# Patient Record
Sex: Female | Born: 1995 | Race: White | Hispanic: No | Marital: Single | Smoking: Never smoker
Health system: Southern US, Community
[De-identification: ages and names within clinical notes are randomized; demographics above are authoritative.]

## PROBLEM LIST (undated history)

## (undated) DIAGNOSIS — F1910 Other psychoactive substance abuse, uncomplicated: Secondary | ICD-10-CM

## (undated) DIAGNOSIS — D649 Anemia, unspecified: Secondary | ICD-10-CM

## (undated) HISTORY — PX: TONSILLECTOMY: SUR1361

---
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
## 2013-09-17 ENCOUNTER — Encounter (HOSPITAL_COMMUNITY): Payer: Self-pay | Admitting: Emergency Medicine

## 2013-09-17 ENCOUNTER — Emergency Department (HOSPITAL_COMMUNITY)
Admission: EM | Admit: 2013-09-17 | Discharge: 2013-09-17 | Disposition: A | Discharge status: Home / Self Care | Payer: 59 | Attending: Emergency Medicine | Admitting: Emergency Medicine

## 2013-09-17 DIAGNOSIS — R5381 Other malaise: Secondary | ICD-10-CM | POA: Insufficient documentation

## 2013-09-17 DIAGNOSIS — R05 Cough: Secondary | ICD-10-CM | POA: Insufficient documentation

## 2013-09-17 DIAGNOSIS — IMO0001 Reserved for inherently not codable concepts without codable children: Secondary | ICD-10-CM | POA: Insufficient documentation

## 2013-09-17 DIAGNOSIS — R059 Cough, unspecified: Secondary | ICD-10-CM | POA: Insufficient documentation

## 2013-09-17 DIAGNOSIS — J351 Hypertrophy of tonsils: Secondary | ICD-10-CM

## 2013-09-17 DIAGNOSIS — J029 Acute pharyngitis, unspecified: Secondary | ICD-10-CM | POA: Insufficient documentation

## 2013-09-17 DIAGNOSIS — J039 Acute tonsillitis, unspecified: Secondary | ICD-10-CM | POA: Insufficient documentation

## 2013-09-17 LAB — MONONUCLEOSIS SCREEN: Mono Screen: NEGATIVE

## 2013-09-17 MED ORDER — DEXAMETHASONE SODIUM PHOSPHATE 10 MG/ML IJ SOLN
10.0000 mg | Freq: Once | INTRAMUSCULAR | Status: AC
  Administered 2013-09-17: 10 mg via INTRAMUSCULAR
  Filled 2013-09-17: qty 1

## 2013-09-17 MED ORDER — OXYCODONE HCL 5 MG/5ML PO SOLN: 5.0000 mg | ORAL | Status: DC | PRN

## 2013-09-17 NOTE — ED Provider Notes (Signed)
CSN: [id number]     Arrival date & time 09/17/13  1934 History  This chart was scribed for non-physician practitioner Dierdre Forth, PA-C working with Juliet Rude. Rubin Payor, MD by Danella Maiers, ED Scribe. This patient was seen in room TR05C/TR05C and the patient's care was started at 8:02 PM.   Chief Complaint  Patient presents with  . Sore Throat   The history is provided by the patient. No language interpreter was used.   HPI Comments: Deborah Houston is a 17 y.o. female who presents to the Emergency Department complaining of unchanged sore throat for the past 3 weeks. She also reports body aches and only mild occasional cough. Pt completed oral Augmentin two days ago given by [facility] medical clinic with no improvement. She denies fevers, chills, ear pain. She denies sick contacts. She is otherwise healthy. She is on no medications currently. She has not been tested for mono or strep.  She endorses associated fatigue and throat swelling.  Nothing makes her symptoms better or worse.     History reviewed. No pertinent past medical history. History reviewed. No pertinent past surgical history. Family History  Problem Relation Age of Onset  . Diabetes Mother   . Alcohol abuse Father    History  Substance Use Topics  . Smoking status: Never Smoker   . Smokeless tobacco: Not on file  . Alcohol Use: No   OB History   Grav Para Term Preterm Abortions TAB SAB Ect Mult Living                 Review of Systems  Constitutional: Positive for fatigue. Negative for fever, chills, diaphoresis, appetite change and unexpected weight change.  HENT: Positive for sore throat. Negative for mouth sores.   Eyes: Negative for visual disturbance.  Respiratory: Negative for cough, chest tightness, shortness of breath and wheezing.   Cardiovascular: Negative for chest pain.  Gastrointestinal: Negative for nausea, vomiting, abdominal pain, diarrhea and constipation.  Endocrine: Negative for  polydipsia, polyphagia and polyuria.  Genitourinary: Negative for dysuria, urgency, frequency and hematuria.  Musculoskeletal: Positive for myalgias. Negative for back pain and neck stiffness.  Skin: Negative for rash.  Allergic/Immunologic: Negative for immunocompromised state.  Neurological: Negative for syncope, light-headedness and headaches.  Hematological: Does not bruise/bleed easily.  Psychiatric/Behavioral: Negative for sleep disturbance. The patient is not nervous/anxious.     Allergies  Review of patient's allergies indicates no known allergies.  Home Medications   Current Outpatient Rx  Name  Route  Sig  Dispense  Refill  . Pseudoephedrine-APAP-DM (DAYQUIL PO)   Oral   Take 10 mLs by mouth daily as needed (for cough).         Marland Kitchen oxyCODONE (ROXICODONE) 5 MG/5ML solution   Oral   Take 5 mLs (5 mg total) by mouth every 4 (four) hours as needed for severe pain.   30 mL   0    BP 122/60  Pulse 73  Temp(Src) 98.3 F (36.8 C) (Oral)  Resp 14  Ht 5\' 8"  (1.727 m)  Wt 140 lb (63.504 kg)  BMI 21.29 kg/m2  SpO2 98%  LMP 08/23/2013 Physical Exam  Nursing note and vitals reviewed. Constitutional: She is oriented to person, place, and time. She appears well-developed and well-nourished. No distress.  Awake, alert, nontoxic appearance  HENT:  Head: Normocephalic and atraumatic.  Right Ear: Tympanic membrane, external ear and ear canal normal.  Left Ear: Tympanic membrane, external ear and ear canal normal.  Nose: No mucosal edema  or rhinorrhea. No epistaxis. Right sinus exhibits no maxillary sinus tenderness and no frontal sinus tenderness. Left sinus exhibits no maxillary sinus tenderness and no frontal sinus tenderness.  Mouth/Throat: Uvula is midline and mucous membranes are normal. Mucous membranes are not pale and not cyanotic. No uvula swelling. Posterior oropharyngeal edema and posterior oropharyngeal erythema present. No oropharyngeal exudate or tonsillar  abscesses.  Erythema with large tonsils, no exudate Uvula nonswollen and midline  Eyes: Conjunctivae are normal. Pupils are equal, round, and reactive to light. No scleral icterus.  Neck: Normal range of motion and full passive range of motion without pain. Neck supple.  Cardiovascular: Normal rate, regular rhythm, normal heart sounds and intact distal pulses.   No murmur heard. Pulmonary/Chest: Effort normal and breath sounds normal. No stridor. No respiratory distress. She has no wheezes.  Abdominal: Soft. Bowel sounds are normal. She exhibits no mass. There is no tenderness. There is no rebound and no guarding.  Musculoskeletal: Normal range of motion. She exhibits no edema.  Lymphadenopathy:       Head (right side): Submandibular and tonsillar adenopathy present. No submental, no preauricular, no posterior auricular and no occipital adenopathy present.       Head (left side): Submandibular and tonsillar adenopathy present. No submental, no preauricular, no posterior auricular and no occipital adenopathy present.    She has cervical adenopathy.       Right cervical: Superficial cervical adenopathy present. No deep cervical and no posterior cervical adenopathy present.      Left cervical: Superficial cervical adenopathy present. No deep cervical and no posterior cervical adenopathy present.  Mild, discrete, tender mobile superficial cervical lymphadenopathy  Neurological: She is alert and oriented to person, place, and time.  Speech is clear and goal oriented Moves extremities without ataxia  Skin: Skin is warm and dry. No purpura and no rash noted. She is not diaphoretic.  No rash  Psychiatric: She has a normal mood and affect.    ED Course  Procedures (including critical care time) Medications  dexamethasone (DECADRON) injection 10 mg (10 mg Intramuscular Given 09/17/13 2041)    DIAGNOSTIC STUDIES: Oxygen Saturation is 98% on RA, normal by my interpretation.    COORDINATION OF  CARE: 8:25 PM- Discussed treatment plan with pt which includes strep and mono test. Pt agrees to plan.    Labs Review Labs Reviewed  RAPID STREP SCREEN  CULTURE, GROUP A STREP  GONOCOCCUS CULTURE  MONONUCLEOSIS SCREEN   Imaging Review No results found.  EKG Interpretation   None       MDM   1. Sore throat   2. Tonsillar enlargement      Berdine Dance presents with persistent sore throat for > 3 weeks.  Strep test negative (which was to be expected as pt has had a full course of abx).  Concern for possible mono.  Will give decadron and test.    9:47 PM Mono negative.  Pt afebrile without tonsillar exudate, negative strep. Presents with mild cervical lymphadenopathy, & dysphagia.  Concern for possible gonorrhea as ssx have not cleared with other treatments and pt is sexually active.  No further abx indicated until cultures return.  Pt does not appear dehydrated, but did discuss importance of water rehydration. Presentation non concerning for PTA or infxn spread to soft tissue. No trismus or uvula deviation. Specific return precautions discussed. Pt able to drink water in ED without difficulty with intact air way. Recommended PCP follow up.  It has been determined that no  acute conditions requiring further emergency intervention are present at this time. The patient/guardian have been advised of the diagnosis and plan. We have discussed signs and symptoms that warrant return to the ED, such as changes or worsening in symptoms.   Vital signs are stable at discharge.   BP 122/60  Pulse 73  Temp(Src) 98.3 F (36.8 C) (Oral)  Resp 14  Ht 5\' 8"  (1.727 m)  Wt 140 lb (63.504 kg)  BMI 21.29 kg/m2  SpO2 98%  LMP 08/23/2013  Patient/guardian has voiced understanding and agreed to follow-up with the PCP or specialist.       Dierdre Forth, PA-C 09/17/13 2152

## 2013-09-17 NOTE — ED Notes (Signed)
Pt. reports sore throat with swelling , " hard to swallow " for 3 weeks , pt. had completed oral Augmentin antibiotic regimen with no improvement . Denies fever or chills. Airway intact / respirations unlabored .

## 2013-09-18 NOTE — ED Provider Notes (Signed)
Medical screening examination/treatment/procedure(s) were performed by non-physician practitioner and as supervising physician I was immediately available for consultation/collaboration.  EKG Interpretation   None        Brennyn Haisley R. Frederik Standley, MD 09/18/13 0004 

## 2013-09-20 LAB — CULTURE, GROUP A STREP

## 2013-09-20 LAB — GONOCOCCUS CULTURE: Special Requests: NORMAL

## 2013-11-29 ENCOUNTER — Emergency Department (HOSPITAL_COMMUNITY)
Admission: EM | Admit: 2013-11-29 | Discharge: 2013-11-29 | Disposition: A | Discharge status: Home / Self Care | Payer: 59 | Source: Home / Self Care | Attending: Family Medicine | Admitting: Family Medicine

## 2013-11-29 ENCOUNTER — Encounter (HOSPITAL_COMMUNITY): Payer: Self-pay | Admitting: Emergency Medicine

## 2013-11-29 DIAGNOSIS — A088 Other specified intestinal infections: Secondary | ICD-10-CM

## 2013-11-29 DIAGNOSIS — A084 Viral intestinal infection, unspecified: Secondary | ICD-10-CM

## 2013-11-29 LAB — COMPREHENSIVE METABOLIC PANEL
ALBUMIN: 3.7 g/dL (ref 3.5–5.2)
ALT: 12 U/L (ref 0–35)
AST: 19 U/L (ref 0–37)
Alkaline Phosphatase: 72 U/L (ref 39–117)
BUN: 12 mg/dL (ref 6–23)
CALCIUM: 9.2 mg/dL (ref 8.4–10.5)
CO2: 25 mEq/L (ref 19–32)
CREATININE: 0.73 mg/dL (ref 0.50–1.10)
Chloride: 100 mEq/L (ref 96–112)
GFR calc Af Amer: >90 mL/min (ref >90)
Glucose, Bld: 89 mg/dL (ref 70–99)
Potassium: 3.9 mEq/L (ref 3.7–5.3)
Sodium: 138 mEq/L (ref 137–147)
Total Bilirubin: 0.4 mg/dL (ref 0.3–1.2)
Total Protein: 7.5 g/dL (ref 6.0–8.3)

## 2013-11-29 LAB — CBC
HCT: 43 % (ref 36.0–46.0)
Hemoglobin: 14.7 g/dL (ref 12.0–15.0)
MCH: 29.1 pg (ref 26.0–34.0)
MCHC: 34.2 g/dL (ref 30.0–36.0)
MCV: 85.1 fL (ref 78.0–100.0)
PLATELETS: 182 10*3/uL (ref 150–400)
RBC: 5.05 MIL/uL (ref 3.87–5.11)
RDW: 14.1 % (ref 11.5–15.5)
WBC: 5.8 10*3/uL (ref 4.0–10.5)

## 2013-11-29 LAB — LIPASE, BLOOD: Lipase: 66 U/L — ABNORMAL HIGH (ref 11–59)

## 2013-11-29 LAB — POCT URINALYSIS DIP (DEVICE)
Bilirubin Urine: NEGATIVE
Glucose, UA: NEGATIVE mg/dL
Hgb urine dipstick: NEGATIVE
Ketones, ur: NEGATIVE mg/dL
Leukocytes, UA: NEGATIVE
Nitrite: NEGATIVE
PH: 7.5 (ref 5.0–8.0)
Protein, ur: NEGATIVE mg/dL
Specific Gravity, Urine: 1.02 (ref 1.005–1.030)
Urobilinogen, UA: 1 mg/dL (ref 0.0–1.0)

## 2013-11-29 LAB — POCT PREGNANCY, URINE: Preg Test, Ur: NEGATIVE

## 2013-11-29 MED ORDER — ONDANSETRON 4 MG PO TBDP
8.0000 mg | ORAL_TABLET | Freq: Once | ORAL | Status: AC
  Administered 2013-11-29: 8 mg via ORAL

## 2013-11-29 MED ORDER — ONDANSETRON 4 MG PO TBDP
ORAL_TABLET | ORAL | Status: AC
  Filled 2013-11-29: qty 1

## 2013-11-29 MED ORDER — ONDANSETRON HCL 8 MG PO TABS: 8.0000 mg | ORAL_TABLET | Freq: Three times a day (TID) | ORAL | Status: DC | PRN

## 2013-11-29 NOTE — ED Notes (Signed)
Patient had onset of vomiting and abdominal pain, generalized -onset Sunday.  Monday also had diarrhea.  On Tuesday, vomiting stopped.  Sunday and Monday had body aches and head aches.  Denies urinary symptoms, no vaginal discharge

## 2013-11-29 NOTE — Discharge Instructions (Signed)
Thank you for coming in today. Uses Zofran every 8 hours as needed for vomiting starting at 11:00 tonight. Followup with your primary care provider if not getting better.  If your belly pain worsens, or you have high fever, bad vomiting, blood in your stool or black tarry stool go to the Emergency Room.    Viral Gastroenteritis Viral gastroenteritis is also known as stomach flu. This condition affects the stomach and intestinal tract. It can cause sudden diarrhea and vomiting. The illness typically lasts 3 to 8 days. Most people develop an immune response that eventually gets rid of the virus. While this natural response develops, the virus can make you quite ill. CAUSES  Many different viruses can cause gastroenteritis, such as rotavirus or noroviruses. You can catch one of these viruses by consuming contaminated food or water. You may also catch a virus by sharing utensils or other personal items with an infected person or by touching a contaminated surface. SYMPTOMS  The most common symptoms are diarrhea and vomiting. These problems can cause a severe loss of body fluids (dehydration) and a body salt (electrolyte) imbalance. Other symptoms may include:  Fever.  Headache.  Fatigue.  Abdominal pain. DIAGNOSIS  Your caregiver can usually diagnose viral gastroenteritis based on your symptoms and a physical exam. A stool sample may also be taken to test for the presence of viruses or other infections. TREATMENT  This illness typically goes away on its own. Treatments are aimed at rehydration. The most serious cases of viral gastroenteritis involve vomiting so severely that you are not able to keep fluids down. In these cases, fluids must be given through an intravenous line (IV). HOME CARE INSTRUCTIONS   Drink enough fluids to keep your urine clear or pale yellow. Drink small amounts of fluids frequently and increase the amounts as tolerated.  Ask your caregiver for specific rehydration  instructions.  Avoid:  Foods high in sugar.  Alcohol.  Carbonated drinks.  Tobacco.  Juice.  Caffeine drinks.  Extremely hot or cold fluids.  Fatty, greasy foods.  Too much intake of anything at one time.  Dairy products until 24 to 48 hours after diarrhea stops.  You may consume probiotics. Probiotics are active cultures of beneficial bacteria. They may lessen the amount and number of diarrheal stools in adults. Probiotics can be found in yogurt with active cultures and in supplements.  Wash your hands well to avoid spreading the virus.  Only take over-the-counter or prescription medicines for pain, discomfort, or fever as directed by your caregiver. Do not give aspirin to children. Antidiarrheal medicines are not recommended.  Ask your caregiver if you should continue to take your regular prescribed and over-the-counter medicines.  Keep all follow-up appointments as directed by your caregiver. SEEK IMMEDIATE MEDICAL CARE IF:   You are unable to keep fluids down.  You do not urinate at least once every 6 to 8 hours.  You develop shortness of breath.  You notice blood in your stool or vomit. This may look like coffee grounds.  You have abdominal pain that increases or is concentrated in one small area (localized).  You have persistent vomiting or diarrhea.  You have a fever.  The patient is a child younger than 3 months, and he or she has a fever.  The patient is a child older than 3 months, and he or she has a fever and persistent symptoms.  The patient is a child older than 3 months, and he or she has a  fever and symptoms suddenly get worse.  The patient is a baby, and he or she has no tears when crying. MAKE SURE YOU:   Understand these instructions.  Will watch your condition.  Will get help right away if you are not doing well or get worse. Document Released: 10/05/2005 Document Revised: 12/28/2011 Document Reviewed: 07/22/2011 [facility] Patient  Information 2014 [place], [place].

## 2013-11-29 NOTE — ED Provider Notes (Signed)
Berdine DanceKayla Houston is a 18 y.o. female who presents to Urgent Care today for vomiting fever and body aches. This is associated with abdominal pain and and has been present for the last several days. She denies any dysuria or urgency urinary frequency or vaginal discharge. The pain is located diffusely across the abdomen. The pain is worsened over the past day or so. She's tried Pepto-Bismol which helps. She denies any blood in her vomit or diarrhea.   History reviewed. No pertinent past medical history. History  Substance Use Topics  . Smoking status: Never Smoker   . Smokeless tobacco: Not on file  . Alcohol Use: No   ROS as above Medications: No current facility-administered medications for this encounter.   Current Outpatient Prescriptions  Medication Sig Dispense Refill  . bismuth subsalicylate (PEPTO BISMOL) 262 MG/15ML suspension Take 30 mLs by mouth every 6 (six) hours as needed.      . calcium carbonate (TUMS - DOSED IN MG ELEMENTAL CALCIUM) 500 MG chewable tablet Chew 1 tablet by mouth daily.      . ondansetron (ZOFRAN) 8 MG tablet Take 1 tablet (8 mg total) by mouth every 8 (eight) hours as needed for nausea or vomiting.  20 tablet  0  . oxyCODONE (ROXICODONE) 5 MG/5ML solution Take 5 mLs (5 mg total) by mouth every 4 (four) hours as needed for severe pain.  30 mL  0  . Pseudoephedrine-APAP-DM (DAYQUIL PO) Take 10 mLs by mouth daily as needed (for cough).        Exam:  BP 116/74  Pulse 88  Temp(Src) 98.2 F (36.8 C) (Oral)  Resp 19  SpO2 99%  LMP 11/07/2013 Gen: Well NAD HEENT: EOMI,  MMM Lungs: Normal work of breathing. CTABL Heart: RRR no MRG Abd: NABS, Soft. Nondistended. Diffusely tender. Mild guarding present Exts: Brisk capillary refill, warm and well perfused.   Results for orders placed during the hospital encounter of 11/29/13 (from the past 24 hour(s))  POCT URINALYSIS DIP (DEVICE)     Status: None   Collection Time    11/29/13  1:51 PM      Result Value Ref  Range   Glucose, UA NEGATIVE  NEGATIVE mg/dL   Bilirubin Urine NEGATIVE  NEGATIVE   Ketones, ur NEGATIVE  NEGATIVE mg/dL   Specific Gravity, Urine 1.020  1.005 - 1.030   Hgb urine dipstick NEGATIVE  NEGATIVE   pH 7.5  5.0 - 8.0   Protein, ur NEGATIVE  NEGATIVE mg/dL   Urobilinogen, UA 1.0  0.0 - 1.0 mg/dL   Nitrite NEGATIVE  NEGATIVE   Leukocytes, UA NEGATIVE  NEGATIVE  POCT PREGNANCY, URINE     Status: None   Collection Time    11/29/13  1:55 PM      Result Value Ref Range   Preg Test, Ur NEGATIVE  NEGATIVE  CBC     Status: None   Collection Time    11/29/13  2:07 PM      Result Value Ref Range   WBC 5.8  4.0 - 10.5 K/uL   RBC 5.05  3.87 - 5.11 MIL/uL   Hemoglobin 14.7  12.0 - 15.0 g/dL   HCT 16.143.0  09.636.0 - 04.546.0 %   MCV 85.1  78.0 - 100.0 fL   MCH 29.1  26.0 - 34.0 pg   MCHC 34.2  30.0 - 36.0 g/dL   RDW 40.914.1  81.111.5 - 91.415.5 %   Platelets 182  150 - 400 K/uL   No  results found.  Assessment and Plan: 18 y.o. female with viral gastroenteritis. Patient has normal laboratory findings and mild tenderness her abdominal exam. Plan to use Zofran as needed. Continue oral fluids and followup as needed. CMP and lipase are pending. Will call patient if results are significantly abnormal.  Discussed warning signs or symptoms. Please see discharge instructions. Patient expresses understanding.    Rodolph Bong, MD 11/29/13 1450

## 2013-12-26 ENCOUNTER — Ambulatory Visit: Payer: Self-pay | Admitting: Otolaryngology

## 2014-01-03 ENCOUNTER — Encounter (HOSPITAL_COMMUNITY)
Admission: EM | Disposition: A | Discharge status: Home / Self Care | Payer: Self-pay | Source: Home / Self Care | Attending: Emergency Medicine

## 2014-01-03 ENCOUNTER — Encounter (HOSPITAL_COMMUNITY): Payer: 59 | Admitting: Anesthesiology

## 2014-01-03 ENCOUNTER — Encounter (HOSPITAL_COMMUNITY): Payer: Self-pay | Admitting: Emergency Medicine

## 2014-01-03 ENCOUNTER — Emergency Department (HOSPITAL_COMMUNITY): Payer: 59 | Admitting: Anesthesiology

## 2014-01-03 ENCOUNTER — Ambulatory Visit (HOSPITAL_COMMUNITY)
Admission: EM | Admit: 2014-01-03 | Discharge: 2014-01-03 | Disposition: A | Discharge status: Home / Self Care | Payer: 59 | Attending: Emergency Medicine | Admitting: Emergency Medicine

## 2014-01-03 DIAGNOSIS — IMO0002 Reserved for concepts with insufficient information to code with codable children: Secondary | ICD-10-CM | POA: Insufficient documentation

## 2014-01-03 DIAGNOSIS — J95830 Postprocedural hemorrhage and hematoma of a respiratory system organ or structure following a respiratory system procedure: Secondary | ICD-10-CM

## 2014-01-03 DIAGNOSIS — Y836 Removal of other organ (partial) (total) as the cause of abnormal reaction of the patient, or of later complication, without mention of misadventure at the time of the procedure: Secondary | ICD-10-CM | POA: Insufficient documentation

## 2014-01-03 DIAGNOSIS — D649 Anemia, unspecified: Secondary | ICD-10-CM | POA: Insufficient documentation

## 2014-01-03 HISTORY — PX: TONSILLECTOMY: SHX5217

## 2014-01-03 HISTORY — DX: Anemia, unspecified: D64.9

## 2014-01-03 LAB — CBC
HCT: 39.9 % (ref 36.0–46.0)
HEMOGLOBIN: 13.2 g/dL (ref 12.0–15.0)
MCH: 28.5 pg (ref 26.0–34.0)
MCHC: 33.1 g/dL (ref 30.0–36.0)
MCV: 86.2 fL (ref 78.0–100.0)
Platelets: 331 10*3/uL (ref 150–400)
RBC: 4.63 MIL/uL (ref 3.87–5.11)
RDW: 14.1 % (ref 11.5–15.5)
WBC: 12 10*3/uL — ABNORMAL HIGH (ref 4.0–10.5)

## 2014-01-03 LAB — PROTIME-INR
INR: 1.09 (ref 0.00–1.49)
PROTHROMBIN TIME: 13.9 s (ref 11.6–15.2)

## 2014-01-03 SURGERY — TONSILLECTOMY
Anesthesia: General | Site: Throat

## 2014-01-03 MED ORDER — ALBUMIN HUMAN 5 % IV SOLN
INTRAVENOUS | Status: AC
  Administered 2014-01-03: 12.5 g via INTRAVENOUS
  Filled 2014-01-03: qty 250

## 2014-01-03 MED ORDER — LIDOCAINE HCL (CARDIAC) 20 MG/ML IV SOLN
INTRAVENOUS | Status: DC | PRN
  Administered 2014-01-03: 1 mg via INTRAVENOUS
  Administered 2014-01-03: 100 mg via INTRAVENOUS

## 2014-01-03 MED ORDER — OXYCODONE HCL 5 MG/5ML PO SOLN: 5.0000 mg | Freq: Once | ORAL | Status: DC | PRN

## 2014-01-03 MED ORDER — MIDAZOLAM HCL 2 MG/2ML IJ SOLN
INTRAMUSCULAR | Status: DC | PRN
  Administered 2014-01-03 (×2): 1 mg via INTRAVENOUS

## 2014-01-03 MED ORDER — FENTANYL CITRATE 0.05 MG/ML IJ SOLN
INTRAMUSCULAR | Status: AC
  Filled 2014-01-03: qty 5

## 2014-01-03 MED ORDER — DEXAMETHASONE SODIUM PHOSPHATE 10 MG/ML IJ SOLN
INTRAMUSCULAR | Status: DC | PRN
  Administered 2014-01-03: 10 mg via INTRAVENOUS

## 2014-01-03 MED ORDER — ESMOLOL HCL 10 MG/ML IV SOLN
10.0000 mg | Freq: Once | INTRAVENOUS | Status: AC
  Administered 2014-01-03: 10 mg via INTRAVENOUS
  Filled 2014-01-03: qty 1

## 2014-01-03 MED ORDER — LORAZEPAM 2 MG/ML IJ SOLN
INTRAMUSCULAR | Status: AC
  Filled 2014-01-03: qty 1

## 2014-01-03 MED ORDER — LIDOCAINE HCL (CARDIAC) 20 MG/ML IV SOLN
INTRAVENOUS | Status: AC
  Filled 2014-01-03: qty 10

## 2014-01-03 MED ORDER — MIDAZOLAM HCL 2 MG/2ML IJ SOLN
INTRAMUSCULAR | Status: AC
  Filled 2014-01-03: qty 2

## 2014-01-03 MED ORDER — FENTANYL CITRATE 0.05 MG/ML IJ SOLN
INTRAMUSCULAR | Status: DC | PRN
  Administered 2014-01-03 (×2): 50 ug via INTRAVENOUS

## 2014-01-03 MED ORDER — LIDOCAINE-EPINEPHRINE 1 %-1:100000 IJ SOLN
INTRAMUSCULAR | Status: AC
  Filled 2014-01-03: qty 1

## 2014-01-03 MED ORDER — PROPOFOL 10 MG/ML IV BOLUS
INTRAVENOUS | Status: DC | PRN
  Administered 2014-01-03: 160 mg via INTRAVENOUS

## 2014-01-03 MED ORDER — SODIUM CHLORIDE 0.9 % IV SOLN
INTRAVENOUS | Status: DC | PRN
Start: 2014-01-03 — End: 2014-01-03
  Administered 2014-01-03 (×2): via INTRAVENOUS

## 2014-01-03 MED ORDER — PROMETHAZINE HCL 25 MG/ML IJ SOLN: 6.2500 mg | INTRAMUSCULAR | Status: DC | PRN

## 2014-01-03 MED ORDER — HYDROMORPHONE HCL PF 1 MG/ML IJ SOLN: 0.2500 mg | INTRAMUSCULAR | Status: DC | PRN

## 2014-01-03 MED ORDER — 0.9 % SODIUM CHLORIDE (POUR BTL) OPTIME
TOPICAL | Status: DC | PRN
  Administered 2014-01-03: 1000 mL

## 2014-01-03 MED ORDER — ONDANSETRON HCL 4 MG/2ML IJ SOLN
INTRAMUSCULAR | Status: DC | PRN
  Administered 2014-01-03: 4 mg via INTRAVENOUS

## 2014-01-03 MED ORDER — SUCCINYLCHOLINE CHLORIDE 20 MG/ML IJ SOLN
INTRAMUSCULAR | Status: DC | PRN
  Administered 2014-01-03: 100 mg via INTRAVENOUS

## 2014-01-03 MED ORDER — SODIUM CHLORIDE 0.9 % IV BOLUS (SEPSIS)
1000.0000 mL | Freq: Once | INTRAVENOUS | Status: AC
  Administered 2014-01-03: 1000 mL via INTRAVENOUS

## 2014-01-03 MED ORDER — PROPOFOL 10 MG/ML IV BOLUS
INTRAVENOUS | Status: AC
  Filled 2014-01-03: qty 20

## 2014-01-03 MED ORDER — ALBUMIN HUMAN 5 % IV SOLN
12.5000 g | INTRAVENOUS | Status: AC | PRN
  Administered 2014-01-03 (×2): 12.5 g via INTRAVENOUS

## 2014-01-03 MED ORDER — ESMOLOL HCL 10 MG/ML IV SOLN
INTRAVENOUS | Status: DC | PRN
  Administered 2014-01-03: 10 mg via INTRAVENOUS

## 2014-01-03 MED ORDER — ESMOLOL BOLUS VIA INFUSION: 500.0000 ug/kg | Freq: Once | INTRAVENOUS | Status: DC

## 2014-01-03 MED ORDER — LORAZEPAM 2 MG/ML IJ SOLN
1.0000 mg | Freq: Once | INTRAMUSCULAR | Status: AC
  Administered 2014-01-03: 1 mg via INTRAVENOUS
  Filled 2014-01-03: qty 1

## 2014-01-03 MED ORDER — OXYCODONE HCL 5 MG PO TABS: 5.0000 mg | ORAL_TABLET | Freq: Once | ORAL | Status: DC | PRN

## 2014-01-03 SURGICAL SUPPLY — 39 items
CANISTER SUCTION 2500CC (MISCELLANEOUS) ×3 IMPLANT
CATH ROBINSON RED A/P 10FR (CATHETERS) IMPLANT
CLEANER TIP ELECTROSURG 2X2 (MISCELLANEOUS) ×3 IMPLANT
COAGULATOR SUCT 6 FR SWTCH (ELECTROSURGICAL) ×1
COAGULATOR SUCT SWTCH 10FR 6 (ELECTROSURGICAL) ×2 IMPLANT
CRADLE DONUT ADULT HEAD (MISCELLANEOUS) IMPLANT
ELECT COATED BLADE 2.86 ST (ELECTRODE) ×3 IMPLANT
ELECT REM PT RETURN 9FT ADLT (ELECTROSURGICAL)
ELECT REM PT RETURN 9FT PED (ELECTROSURGICAL)
ELECTRODE REM PT RETRN 9FT PED (ELECTROSURGICAL) IMPLANT
ELECTRODE REM PT RTRN 9FT ADLT (ELECTROSURGICAL) IMPLANT
GAUZE SPONGE 4X4 16PLY XRAY LF (GAUZE/BANDAGES/DRESSINGS) ×3 IMPLANT
GLOVE BIO SURGEON STRL SZ 6.5 (GLOVE) ×2 IMPLANT
GLOVE BIO SURGEONS STRL SZ 6.5 (GLOVE) ×1
GLOVE SS BIOGEL STRL SZ 7.5 (GLOVE) ×1 IMPLANT
GLOVE SUPERSENSE BIOGEL SZ 7.5 (GLOVE) ×2
GOWN STRL REUS W/ TWL LRG LVL3 (GOWN DISPOSABLE) ×2 IMPLANT
GOWN STRL REUS W/TWL LRG LVL3 (GOWN DISPOSABLE) ×4
KIT BASIN OR (CUSTOM PROCEDURE TRAY) ×3 IMPLANT
KIT ROOM TURNOVER OR (KITS) ×3 IMPLANT
NS IRRIG 1000ML POUR BTL (IV SOLUTION) ×3 IMPLANT
PACK SURGICAL SETUP 50X90 (CUSTOM PROCEDURE TRAY) ×3 IMPLANT
PAD ARMBOARD 7.5X6 YLW CONV (MISCELLANEOUS) ×6 IMPLANT
PENCIL BUTTON HOLSTER BLD 10FT (ELECTRODE) ×3 IMPLANT
PENCIL FOOT CONTROL (ELECTRODE) ×3 IMPLANT
SPECIMEN JAR SMALL (MISCELLANEOUS) ×6 IMPLANT
SPONGE TONSIL 1 RF SGL (DISPOSABLE) ×3 IMPLANT
SYR BULB 3OZ (MISCELLANEOUS) ×3 IMPLANT
TOWEL OR 17X24 6PK STRL BLUE (TOWEL DISPOSABLE) ×6 IMPLANT
TUBE CONNECTING 12'X1/4 (SUCTIONS) ×1
TUBE CONNECTING 12X1/4 (SUCTIONS) ×2 IMPLANT
TUBE CONNECTING 20'X1/4 (TUBING) ×1
TUBE CONNECTING 20X1/4 (TUBING) ×2 IMPLANT
TUBE SALEM SUMP 10F W/ARV (TUBING) IMPLANT
TUBE SALEM SUMP 12R W/ARV (TUBING) ×3 IMPLANT
TUBE SALEM SUMP 14F W/ARV (TUBING) IMPLANT
TUBE SALEM SUMP 16 FR W/ARV (TUBING) IMPLANT
WATER STERILE IRR 1000ML POUR (IV SOLUTION) ×3 IMPLANT
YANKAUER SUCT BULB TIP NO VENT (SUCTIONS) ×3 IMPLANT

## 2014-01-03 NOTE — ED Notes (Addendum)
MD at bedside. Upon arrival. EDP [facility]. Pt with active bleeding post tonsillectomy. Pt airway intact. Suctioning without difficulty. LAC IV infiltrated 2nd IV started.  Blood drawn. ENT called by EDP-       [facility]  At 0953 [facility] TRANSFER PT WITH BELONGS TO [facility].

## 2014-01-03 NOTE — ED Notes (Signed)
Bed: [id number] Expected date:  Expected time:  Means of arrival:  Comments: EMS-bleed

## 2014-01-03 NOTE — ED Provider Notes (Signed)
CSN: [id number]     Arrival date & time 01/03/14  0917 History   First MD Initiated Contact with Patient 01/03/14 [phone]     Chief Complaint  Patient presents with  . post op bleeding      (Consider location/radiation/quality/duration/timing/severity/associated sxs/prior Treatment) HPI Comments: Bleeding s/p tonsillectomy 1 week ago - began bleeding yesterday - minimal, now having copious bleeding that began about 30 minutes ago.   Patient is a 18 y.o. female presenting with pharyngitis. The history is provided by the patient.  Sore Throat This is a new problem. The current episode started 1 to 2 hours ago. The problem occurs constantly. The problem has not changed since onset.Pertinent negatives include no chest pain, no abdominal pain and no shortness of breath. Nothing aggravates the symptoms. Nothing relieves the symptoms.    No past medical history on file.  No past surgical history on file. Tonsillectomy 1 week ago Family History  Problem Relation Age of Onset  . Diabetes Mother   . Alcohol abuse Father    History  Substance Use Topics  . Smoking status: Never Smoker   . Smokeless tobacco: Not on file  . Alcohol Use: No   OB History   Grav Para Term Preterm Abortions TAB SAB Ect Mult Living                 Review of Systems  Constitutional: Negative for fever.  Respiratory: Negative for cough and shortness of breath.   Cardiovascular: Negative for chest pain.  Gastrointestinal: Negative for abdominal pain.  All other systems reviewed and are negative.      Allergies  Review of patient's allergies indicates no known allergies.  Home Medications   Current Outpatient Rx  Name  Route  Sig  Dispense  Refill  . bismuth subsalicylate (PEPTO BISMOL) 262 MG/15ML suspension   Oral   Take 30 mLs by mouth every 6 (six) hours as needed.         . calcium carbonate (TUMS - DOSED IN MG ELEMENTAL CALCIUM) 500 MG chewable tablet   Oral   Chew 1 tablet by mouth daily.         . ondansetron (ZOFRAN) 8 MG tablet   Oral   Take 1 tablet (8 mg total) by mouth every 8 (eight) hours as needed for nausea or vomiting.   20 tablet   0   . oxyCODONE (ROXICODONE) 5 MG/5ML solution   Oral   Take 5 mLs (5 mg total) by mouth every 4 (four) hours as needed for severe pain.   30 mL   0   . Pseudoephedrine-APAP-DM (DAYQUIL PO)   Oral   Take 10 mLs by mouth daily as needed (for cough).          There were no vitals taken for this visit. Physical Exam  Nursing note and vitals reviewed. Constitutional: She is oriented to person, place, and time. She appears well-developed and well-nourished. No distress.  HENT:  Head: Normocephalic and atraumatic.  Mouth/Throat:    Eyes: EOM are normal. Pupils are equal, round, and reactive to light.  Neck: Normal range of motion. Neck supple.  Cardiovascular: Normal rate and regular rhythm.  Exam reveals no friction rub.   No murmur heard. Pulmonary/Chest: Effort normal and breath sounds normal. No respiratory distress. She has no wheezes. She has no rales.  Abdominal: Soft. She exhibits no distension. There is no tenderness. There is no rebound.  Musculoskeletal: Normal range of motion. She exhibits no  edema.  Neurological: She is alert and oriented to person, place, and time.  Skin: She is not diaphoretic.    ED Course  Procedures (including critical care time) Labs Review Labs Reviewed  CBC  PROTIME-INR   Imaging Review No results found.   EKG Interpretation None      MDM   Final diagnoses:  Post-tonsillectomy hemorrhage    22F 1 week s/p tonsillectomy by Dr. [place]ennett [place] presents with bleeding. Tonsils began bleeding yesterday, but greatly increased this morning. Have been bleeding for roughly 45 minutes. Not on any pain meds or anticoagulants. States she didn't eat or do anything else to cause the bleeding.  On exam, copious bleeding from posterior pharynx. Constantly spitting out blood, clots.  No respiratory distress, lungs clear, patient feels like if she lies down she might choke on her own blood.  ENT consulted. [facility] Jear[facility]Byers [facility] Dr. Blinda LeatherwoodPollina aware [place] coming [place] [facility]. Ativan given for patient to help with anxiety.  Dagmar HaitWilliam Dezmen Alcock, MD 01/03/14 [phone]

## 2014-01-03 NOTE — Op Note (Signed)
Preop/postop diagnoses: Tonsil hemorrhage Procedure: Cauterization of bilateral tonsil hemorrhage Anesthesia: Gen. Estimated blood loss: Approximately 5 cc Indications: 18 year old who is 8 days post tonsillectomy and had bleeding yesterday. Seen by her ear nose and throat doctor and treated conservatively. This morning she started bleeding profusely and has been bleeding for approximately an hour and half. She does have a history of a anemia which is treated with iron. Her hemoglobin was 14 yesterday and 13 at presentation today. She has been informed of the procedure including risks, benefits, and options. All her questions are answered and consent was obtained. Operation: Patient was taken to the operating room and after general endotracheal tube anesthesia was placed in the rose position and the Crowe-Davis mouth gag was inserted retracted and suspended from the Mayo stand. The right tonsil had some clot on it and this was removed. There was a small area of oozing that was cauterized with the suction cautery. There was the expected amount eschar. The left tonsil a clot was removed and immediately a arterial bleed was present. It was easily cauterized with the suction cautery and immediately stopped. There was also an  area in  the superior pole that was oozing but no arterial bleeding. That was cauterized as well. The area was irrigated with saline. There was good hemostasis. The Crowe-Davis was released and resuspended and there was good hemostasis in all locations. The esophagus and stomach were suctioned out with an NG tube and a lot of clot was suctioned. She was then awakened brought to recovery in stable condition counts correct

## 2014-01-03 NOTE — ED Notes (Signed)
Per EMS- Patient had a tonsillectomy a week ago and began having bleeding this AM with clots. EMS reported approx 250 ml blood loss with clots. Patient severely anxious. Patient was given 200 ml NS prior to arrival to the ED.

## 2014-01-03 NOTE — ED Notes (Signed)
Pt transported to OR with RN and ENT physician at bedside.

## 2014-01-03 NOTE — Discharge Instructions (Signed)
Deborah DanceKayla Houston may need 2-3 days of time off from work due to her current medical condition. 01/03/14

## 2014-01-03 NOTE — Preoperative (Signed)
Beta Blockers   Reason not to administer Beta Blockers:Not Applicable 

## 2014-01-03 NOTE — Anesthesia Procedure Notes (Signed)
Procedure Name: Intubation Date/Time: 01/03/2014 10:43 AM Performed by: Marena ChancyBECKNER, Brunette Lavalle S Pre-anesthesia Checklist: Patient identified, Emergency Drugs available, Suction available, Patient being monitored and Timeout performed Patient Re-evaluated:Patient Re-evaluated prior to inductionOxygen Delivery Method: Circle system utilized Preoxygenation: Pre-oxygenation with 100% oxygen Intubation Type: IV induction, Cricoid Pressure applied and Rapid sequence Laryngoscope Size: Mac and 3 Grade View: Grade I Tube type: Oral Tube size: 7.0 mm Number of attempts: 1 Airway Equipment and Method: Stylet Placement Confirmation: ETT inserted through vocal cords under direct vision,  positive ETCO2 and CO2 detector Secured at: 21 cm Dental Injury: Teeth and Oropharynx as per pre-operative assessment and Bloody posterior oropharynx  Comments: Oropharynx suctioned for copious amounts of blood d/t bleeding tonsil. Intubation performed by Delia ChimesAshley Vosh, CRNA

## 2014-01-03 NOTE — Progress Notes (Signed)
Pt is stable to go home, HR came down to 130's, no pain or nausea. Per anesthesia pt can go home when ever she feels comfortable. Pt mentioned going to her grandparents house with friend Alecia LemmingVictor. Rolled her out in a wheel chair. No complaints.  Danne HarborJohny,RN

## 2014-01-03 NOTE — OR Nursing (Signed)
Patient's belongings consists of: a pair of brown Rainbow sandals, Samsung phone with white and pink phone case, gray underwear, black shorts, and gray stretchy yoga pants.  [facility] [employer] class ring and other jewelry removed and placed into specimen container. Pair of contacts removed and placed into sterile container with 0.9% sodium chloride. All belongings placed into patient belonging bag; will transport with patient to PACU.

## 2014-01-03 NOTE — Transfer of Care (Signed)
Immediate Anesthesia Transfer of Care Note  Patient: Deborah Houston  Procedure(s) Performed: Procedure(s): TONSILLAR BLEED (N/A)  Patient Location: PACU  Anesthesia Type:General  Level of Consciousness: lethargic and responds to stimulation  Airway & Oxygen Therapy: Patient Spontanous Breathing and Patient connected to nasal cannula oxygen  Post-op Assessment: Report given to PACU RN and Post -op Vital signs reviewed and stable  Post vital signs: Reviewed and stable  Complications: No apparent anesthesia complications

## 2014-01-03 NOTE — ED Notes (Signed)
Pt arrives [facility] as a transfer [facility] for post op bleeding.

## 2014-01-03 NOTE — ED Notes (Signed)
Blood drawn.

## 2014-01-03 NOTE — Anesthesia Preprocedure Evaluation (Addendum)
Anesthesia Evaluation  Patient identified by MRN, date of birth, ID band Patient awake    Reviewed: Allergy & Precautions, H&P , NPO status , Patient's Chart, lab work & pertinent test results  Airway Mallampati: I      Dental  (+) Teeth Intact   Pulmonary          Cardiovascular     Neuro/Psych    GI/Hepatic   Endo/Other    Renal/GU      Musculoskeletal   Abdominal   Peds  Hematology   Anesthesia Other Findings S/p tonsillectomy-now with significant bleed  Reproductive/Obstetrics                          Anesthesia Physical Anesthesia Plan  ASA: I and emergent  Anesthesia Plan: General   Post-op Pain Management:    Induction: Intravenous, Cricoid pressure planned and Rapid sequence  Airway Management Planned: Oral ETT  Additional Equipment:   Intra-op Plan:   Post-operative Plan: Extubation in OR  Informed Consent: I have reviewed the patients History and Physical, chart, labs and discussed the procedure including the risks, benefits and alternatives for the proposed anesthesia with the patient or authorized representative who has indicated his/her understanding and acceptance.   Dental advisory given  Plan Discussed with: CRNA, Anesthesiologist and Surgeon  Anesthesia Plan Comments:         Anesthesia Quick Evaluation

## 2014-01-03 NOTE — H&P (Signed)
Deborah Houston is an 18 y.o. female.   Chief Complaint: Tonsil hemorrhage HPI: 18 year old with a tonsillectomy about one week ago and had bleeding yesterday. She was seen at her otolaryngologist office and treated conservatively. This morning she started bleeding more profusely and came to the emergency room here and [facility]. She is still currently actively bleeding. She says she has a history of anemia and workup years ago. It is no longer followed but she does take iron. She has no other medical problems.  Past Medical History  Diagnosis Date  . Anemia     Past Surgical History  Procedure Laterality Date  . Tonsillectomy      Family History  Problem Relation Age of Onset  . Diabetes Mother   . Alcohol abuse Father    Social History:  reports that she has never smoked. She does not have any smokeless tobacco history on file. She reports that she does not drink alcohol or use illicit drugs.  Allergies: No Known Allergies  Medications Prior to Admission  Medication Sig Dispense Refill  . bismuth subsalicylate (PEPTO BISMOL) 262 MG/15ML suspension Take 30 mLs by mouth every 6 (six) hours as needed.      . calcium carbonate (TUMS - DOSED IN MG ELEMENTAL CALCIUM) 500 MG chewable tablet Chew 1 tablet by mouth daily.      . ondansetron (ZOFRAN) 8 MG tablet Take 1 tablet (8 mg total) by mouth every 8 (eight) hours as needed for nausea or vomiting.  20 tablet  0  . oxyCODONE (ROXICODONE) 5 MG/5ML solution Take 5 mLs (5 mg total) by mouth every 4 (four) hours as needed for severe pain.  30 mL  0  . Pseudoephedrine-APAP-DM (DAYQUIL PO) Take 10 mLs by mouth daily as needed (for cough).        Results for orders placed during the hospital encounter of 01/03/14 (from the past 48 hour(s))  CBC     Status: Abnormal   Collection Time    01/03/14  9:28 AM      Result Value Ref Range   WBC 12.0 (*) 4.0 - 10.5 K/uL   RBC 4.63  3.87 - 5.11 MIL/uL   Hemoglobin 13.2  12.0 - 15.0 g/dL   HCT 16.1   09.6 - 04.5 %   MCV 86.2  78.0 - 100.0 fL   MCH 28.5  26.0 - 34.0 pg   MCHC 33.1  30.0 - 36.0 g/dL   RDW 40.9  81.1 - 91.4 %   Platelets 331  150 - 400 K/uL  PROTIME-INR     Status: None   Collection Time    01/03/14  9:28 AM      Result Value Ref Range   Prothrombin Time 13.9  11.6 - 15.2 seconds   INR 1.09  0.00 - 1.49   No results found.  Review of Systems  Constitutional: Positive for diaphoresis.  HENT: Positive for sore throat.   Eyes: Negative.   Respiratory: Negative.   Cardiovascular: Negative.   Gastrointestinal: Positive for nausea.  Skin: Negative.     Blood pressure 95/61, pulse 101, temperature 97.7 F (36.5 C), temperature source Axillary, resp. rate 22, SpO2 100.00%. Physical Exam  Constitutional: She appears well-developed.  HENT:  Head: Normocephalic.  She is actively bleeding and has a large clot on the left tonsil. There is no excessive swelling. Neck is without adenopathy or swelling.  Eyes: Pupils are equal, round, and reactive to light.  Neck: Normal range of motion. Neck  supple.  Cardiovascular:  Tachycardia  Respiratory: Effort normal.  GI: Soft.     Assessment/Plan Tonsil hemorrhage-she was informed a risk and benefits of the procedure to immediately go to the operating room for control. Her hemoglobin appears to be stable with 13.2.  Deborah Houston, Deborah Houston 01/03/2014, 11:15 AM

## 2014-01-03 NOTE — ED Notes (Signed)
Pt profusely hemorrhaging from surgical site. Maintaining airway. ENT MD at bedside and ready to transport patient to OR.

## 2014-01-03 NOTE — Anesthesia Postprocedure Evaluation (Signed)
  Anesthesia Post-op Note  Patient: Deborah Houston  Procedure(s) Performed: Procedure(s): TONSILLAR BLEED (N/A)  Patient Location: PACU  Anesthesia Type:General  Level of Consciousness: awake and alert   Airway and Oxygen Therapy: Patient Spontanous Breathing  Post-op Pain: mild  Post-op Assessment: Post-op Vital signs reviewed, Patient's Cardiovascular Status Stable, Respiratory Function Stable, Patent Airway, No signs of Nausea or vomiting and Pain level controlled  Post-op Vital Signs: Reviewed and stable  Complications: No apparent anesthesia complications

## 2014-01-05 ENCOUNTER — Encounter (HOSPITAL_COMMUNITY): Payer: Self-pay | Admitting: Otolaryngology

## 2014-07-22 ENCOUNTER — Encounter (HOSPITAL_COMMUNITY): Payer: Self-pay | Admitting: Emergency Medicine

## 2014-07-22 ENCOUNTER — Emergency Department (HOSPITAL_COMMUNITY)
Admission: EM | Admit: 2014-07-22 | Discharge: 2014-07-22 | Disposition: A | Discharge status: Home / Self Care | Payer: 59 | Attending: Emergency Medicine | Admitting: Emergency Medicine

## 2014-07-22 DIAGNOSIS — Z79899 Other long term (current) drug therapy: Secondary | ICD-10-CM | POA: Diagnosis not present

## 2014-07-22 DIAGNOSIS — R000 Tachycardia, unspecified: Secondary | ICD-10-CM | POA: Diagnosis not present

## 2014-07-22 DIAGNOSIS — Z862 Personal history of diseases of the blood and blood-forming organs and certain disorders involving the immune mechanism: Secondary | ICD-10-CM | POA: Diagnosis not present

## 2014-07-22 DIAGNOSIS — Z9089 Acquired absence of other organs: Secondary | ICD-10-CM | POA: Insufficient documentation

## 2014-07-22 DIAGNOSIS — F419 Anxiety disorder, unspecified: Secondary | ICD-10-CM | POA: Diagnosis not present

## 2014-07-22 DIAGNOSIS — H9201 Otalgia, right ear: Secondary | ICD-10-CM | POA: Diagnosis present

## 2014-07-22 DIAGNOSIS — Z792 Long term (current) use of antibiotics: Secondary | ICD-10-CM | POA: Diagnosis not present

## 2014-07-22 DIAGNOSIS — H6091 Unspecified otitis externa, right ear: Secondary | ICD-10-CM | POA: Insufficient documentation

## 2014-07-22 MED ORDER — CIPROFLOXACIN-DEXAMETHASONE 0.3-0.1 % OT SUSP: 4.0000 [drp] | Freq: Two times a day (BID) | OTIC | Status: DC

## 2014-07-22 NOTE — ED Notes (Signed)
Pt. Stated, i got something in my rt. Ear i don't know if its a bug or what.

## 2014-07-22 NOTE — Discharge Instructions (Signed)
Apply antibiotic ear drops as directed. Do not use q-tips in your ear.  Otitis Externa Otitis externa is a bacterial or fungal infection of the outer ear canal. This is the area from the eardrum to the outside of the ear. Otitis externa is sometimes called "swimmer's ear." CAUSES  Possible causes of infection include:  Swimming in dirty water.  Moisture remaining in the ear after swimming or bathing.  Mild injury (trauma) to the ear.  Objects stuck in the ear (foreign body).  Cuts or scrapes (abrasions) on the outside of the ear. SIGNS AND SYMPTOMS  The first symptom of infection is often itching in the ear canal. Later signs and symptoms may include swelling and redness of the ear canal, ear pain, and yellowish-white fluid (pus) coming from the ear. The ear pain may be worse when pulling on the earlobe. DIAGNOSIS  Your health care provider will perform a physical exam. A sample of fluid may be taken from the ear and examined for bacteria or fungi. TREATMENT  Antibiotic ear drops are often given for 10 to 14 days. Treatment may also include pain medicine or corticosteroids to reduce itching and swelling. HOME CARE INSTRUCTIONS   Apply antibiotic ear drops to the ear canal as prescribed by your health care provider.  Take medicines only as directed by your health care provider.  If you have diabetes, follow any additional treatment instructions from your health care provider.  Keep all follow-up visits as directed by your health care provider. PREVENTION   Keep your ear dry. Use the corner of a towel to absorb water out of the ear canal after swimming or bathing.  Avoid scratching or putting objects inside your ear. This can damage the ear canal or remove the protective wax that lines the canal. This makes it easier for bacteria and fungi to grow.  Avoid swimming in lakes, polluted water, or poorly chlorinated pools.  You may use ear drops made of rubbing alcohol and vinegar  after swimming. Combine equal parts of white vinegar and alcohol in a bottle. Put 3 or 4 drops into each ear after swimming. SEEK MEDICAL CARE IF:   You have a fever.  Your ear is still red, swollen, painful, or draining pus after 3 days.  Your redness, swelling, or pain gets worse.  You have a severe headache.  You have redness, swelling, pain, or tenderness in the area behind your ear. MAKE SURE YOU:   Understand these instructions.  Will watch your condition.  Will get help right away if you are not doing well or get worse. Document Released: 10/05/2005 Document Revised: 02/19/2014 Document Reviewed: 10/22/2011 [facility] Patient Information 2015 [employer] [place]. This information is not intended to replace advice given to you by your health care provider. Make sure you discuss any questions you have with your health care provider.

## 2014-07-22 NOTE — ED Provider Notes (Signed)
Medical screening examination/treatment/procedure(s) were performed by non-physician practitioner and as supervising physician I was immediately available for consultation/collaboration.  Daneil Beem L Azilee Pirro, MD 07/22/14 1519 

## 2014-07-22 NOTE — ED Provider Notes (Signed)
CSN: [id number]     Arrival date & time 07/22/14  4098 History   First MD Initiated Contact with Patient 07/22/14 [phone]     Chief Complaint  Patient presents with  . Otalgia     (Consider location/radiation/quality/duration/timing/severity/associated sxs/prior Treatment) HPI Comments: This is a 18 year old female with a past medical history of anemia and anxiety who presents to the emergency department complaining of right ear pain x2 days. Patient reports over the past 2 days she has noticed a full feeling of her right ear with associated muffled hearing. Today she thought that there may have been something in her ear, she used two ear-wick candles and was able to remove some ear wax. She then thought there may have been a black scab in her ear, developed a sharp, stabbing pain, and got nervous sitting to the emergency department. Denies fever, chills or ear drainage. She uses q-tips.  Patient is a 18 y.o. female presenting with ear pain. The history is provided by the patient.  Otalgia Associated symptoms: no fever     Past Medical History  Diagnosis Date  . Anemia    Past Surgical History  Procedure Laterality Date  . Tonsillectomy    . Tonsillectomy N/A 01/03/2014    Procedure: TONSILLAR BLEED;  Surgeon: Suzanna Obey, MD;  Location: [facility] OR;  Service: ENT;  Laterality: N/A;   Family History  Problem Relation Age of Onset  . Diabetes Mother   . Alcohol abuse Father    History  Substance Use Topics  . Smoking status: Never Smoker   . Smokeless tobacco: Not on file  . Alcohol Use: No   OB History   Grav Para Term Preterm Abortions TAB SAB Ect Mult Living                 Review of Systems  Constitutional: Negative for fever.  HENT: Positive for ear pain.   Respiratory: Negative.   Cardiovascular: Negative.   Skin: Negative.   Neurological: Negative.       Allergies  Review of patient's allergies indicates no known allergies.  Home Medications   Prior to Admission  medications   Medication Sig Start Date End Date Taking? Authorizing [place]  bismuth subsalicylate (PEPTO BISMOL) 262 MG/15ML suspension Take 30 mLs by mouth every 6 (six) hours as needed.    Historical Rufino Staup, MD  calcium carbonate (TUMS - DOSED IN MG ELEMENTAL CALCIUM) 500 MG chewable tablet Chew 1 tablet by mouth daily.    Historical Turner Kunzman, MD  ciprofloxacin-dexamethasone (CIPRODEX) otic suspension Place 4 drops into the right ear 2 (two) times daily. x5 days 07/22/14   Trevor Mace, PA-C  ondansetron (ZOFRAN) 8 MG tablet Take 1 tablet (8 mg total) by mouth every 8 (eight) hours as needed for nausea or vomiting. 11/29/13   Rodolph Bong, MD  oxyCODONE (ROXICODONE) 5 MG/5ML solution Take 5 mLs (5 mg total) by mouth every 4 (four) hours as needed for severe pain. 09/17/13   Hannah Muthersbaugh, PA-C  Pseudoephedrine-APAP-DM (DAYQUIL PO) Take 10 mLs by mouth daily as needed (for cough).    Historical Skylinn Vialpando, MD   BP 133/62  Pulse 117  Temp(Src) 98 F (36.7 C) (Oral)  Resp 20  SpO2 100% Physical Exam  Nursing note and vitals reviewed. Constitutional: She is oriented to person, place, and time. She appears well-developed and well-nourished. No distress.  HENT:  Head: Normocephalic and atraumatic.  Mouth/Throat: Oropharynx is clear and moist.  R ear canal inflamed and erythematous.  No drainage. No FB. Normal TM.  Eyes: Conjunctivae and EOM are normal.  Neck: Normal range of motion. Neck supple.  Cardiovascular: Regular rhythm and normal heart sounds.   Tachycardic.  Pulmonary/Chest: Effort normal and breath sounds normal. No respiratory distress.  Musculoskeletal: Normal range of motion. She exhibits no edema.  Neurological: She is alert and oriented to person, place, and time. No sensory deficit.  Skin: Skin is warm and dry.  Psychiatric: She has a normal mood and affect. Her behavior is normal.    ED Course  Procedures (including critical care time) Labs Review Labs  Reviewed - No data to display  Imaging Review No results found.   EKG Interpretation None      MDM   Final diagnoses:  Otitis externa, right   Pt well appearing and in NAD. Anxious, tachycardic, vitals otherwise stable. Afebrile. Treat with ciprodex. Advised against use of q-tips. Stable for d/c. Return precautions given. Patient states understanding of treatment care plan and is agreeable.  Trevor MaceRobyn M Albert, PA-C 07/22/14 [phone]

## 2014-07-22 NOTE — ED Notes (Signed)
Declined W/C at D/C and was escorted to lobby by RN. 

## 2014-08-09 ENCOUNTER — Emergency Department (INDEPENDENT_AMBULATORY_CARE_PROVIDER_SITE_OTHER)
Admission: EM | Admit: 2014-08-09 | Discharge: 2014-08-10 | Disposition: A | Discharge status: Home / Self Care | Payer: 59 | Source: Home / Self Care

## 2014-08-09 DIAGNOSIS — H9209 Otalgia, unspecified ear: Secondary | ICD-10-CM

## 2014-08-10 ENCOUNTER — Emergency Department (HOSPITAL_COMMUNITY)
Admission: EM | Admit: 2014-08-10 | Discharge: 2014-08-10 | Disposition: A | Discharge status: Home / Self Care | Payer: 59 | Attending: Emergency Medicine | Admitting: Emergency Medicine

## 2014-08-10 ENCOUNTER — Encounter (HOSPITAL_COMMUNITY): Payer: Self-pay | Admitting: Emergency Medicine

## 2014-08-10 DIAGNOSIS — D649 Anemia, unspecified: Secondary | ICD-10-CM | POA: Diagnosis not present

## 2014-08-10 DIAGNOSIS — R040 Epistaxis: Secondary | ICD-10-CM | POA: Diagnosis present

## 2014-08-10 DIAGNOSIS — Z79899 Other long term (current) drug therapy: Secondary | ICD-10-CM | POA: Diagnosis not present

## 2014-08-10 MED ORDER — OXYMETAZOLINE HCL 0.05 % NA SOLN
2.0000 | Freq: Once | NASAL | Status: AC
  Administered 2014-08-10: 2 via NASAL
  Filled 2014-08-10: qty 15

## 2014-08-10 NOTE — ED Notes (Addendum)
Patient c/o right ear pain x 3 weeks. She was seen in the ER and has had no improvement. Patient reports she was given drops but unsure what kind. Patient is in NAD.

## 2014-08-10 NOTE — ED Notes (Signed)
Pt in c/o intermittent nose bleed since around 8am, states she has had nose bleeds in the past but nothing that has lasted this long, pt reports episodes of dizziness this afternoon which prompted her to come in, minimal bleeding if any noted during triage, but patient reports she can feel the blood running down the back of her throat

## 2014-08-10 NOTE — Discharge Instructions (Signed)

## 2014-08-10 NOTE — ED Provider Notes (Signed)
CSN: [id number]     Arrival date & time 08/10/14  1609 History   First MD Initiated Contact with Patient 08/10/14 1629     Chief Complaint  Patient presents with  . Epistaxis    Patient is a 18 y.o. female presenting with nosebleeds. The history is provided by the patient.  Epistaxis Location:  R nare Severity:  Severe Duration:  1 day Timing:  Constant Progression:  Resolved Chronicity:  New Context: weather change   Context: not anticoagulants, not bleeding disorder, not drug use, not nose picking and not trauma   Relieved by:  None tried Worsened by:  Nothing tried Ineffective treatments:  Applying pressure Associated symptoms: no blood in oropharynx, no fever and no sore throat   Risk factors: no recent nasal surgery     Past Medical History  Diagnosis Date  . Anemia    Past Surgical History  Procedure Laterality Date  . Tonsillectomy    . Tonsillectomy N/A 01/03/2014    Procedure: TONSILLAR BLEED;  Surgeon: Suzanna ObeyJohn Byers, MD;  [facility];  Service: ENT;  Laterality: N/A;   Family History  Problem Relation Age of Onset  . Diabetes Mother   . Alcohol abuse Father    History  Substance Use Topics  . Smoking status: Never Smoker   . Smokeless tobacco: Not on file  . Alcohol Use: No   OB History   Grav Para Term Preterm Abortions TAB SAB Ect Mult Living                 Review of Systems  Constitutional: Negative for fever.  HENT: Positive for nosebleeds. Negative for sinus pressure and sore throat.   Respiratory: Negative for shortness of breath.   Gastrointestinal: Negative for nausea and vomiting.  Skin: Negative for rash and wound.  All other systems reviewed and are negative.     Allergies  Review of patient's allergies indicates no known allergies.  Home Medications   Prior to Admission medications   Medication Sig Start Date End Date Taking? Authorizing Provider  BIOTIN PO Take 1 tablet by mouth daily.    Historical Provider, MD   ciprofloxacin-dexamethasone (CIPRODEX) otic suspension Place 4 drops into the right ear 2 (two) times daily. x5 days 07/22/14   Kathrynn Speedobyn M Hess, PA-C  IRON PO Take 1 tablet by mouth daily.    Historical Provider, MD  LORazepam (ATIVAN PO) Take 0.5 tablets by mouth daily.    Historical Provider, MD  Multiple Vitamin (MULTIVITAMIN WITH MINERALS) TABS tablet Take 1 tablet by mouth daily.    Historical Provider, MD  sertraline (ZOLOFT) 25 MG tablet Take 25 mg by mouth daily.    Historical Provider, MD   BP 132/54  Pulse 112  Temp(Src) 99 F (37.2 C) (Oral)  Resp 16  Ht 5\' 8"  (1.727 m)  Wt 139 lb (63.05 kg)  BMI 21.14 kg/m2  SpO2 100%  Physical Exam  Constitutional: She is oriented to person, place, and time. She appears well-developed and well-nourished. She is cooperative. No distress.  HENT:  Head: Normocephalic and atraumatic.  Right Ear: External ear normal.  Left Ear: External ear normal.  Nose: Epistaxis (clot noted in right nare; no active bleeding) is observed.  Neck: Normal range of motion and phonation normal.  Cardiovascular: Normal rate and regular rhythm.   Pulmonary/Chest: Effort normal and breath sounds normal. No respiratory distress. She has no wheezes. She has no rales.  Abdominal: Soft. She exhibits no distension. There is no  tenderness. There is no rebound and no guarding.  Neurological: She is alert and oriented to person, place, and time.  Skin: Skin is warm and dry. No rash noted. She is not diaphoretic.    ED Course  Procedures   MDM   Final diagnoses:  Epistaxis   18 y.o. female presents with epistaxis from her right nare. Bleeding is controlled at time of my exam. She reports no injury or trauma. Denies any drug use. She has a history of epistaxis for the last 3 months. She attributes this to taking Zoloft.  Exam as above.  Will treat with Afrin to the right nare.   On reevaluation the patient continues to have hemostasis of the right knee her. She  has not had concerns. I discussed that Zoloft can cause platelet dysfunction in approximately one percent of patients who take it. I advised her to discuss this with her primary care physician. She is to followup with her primary care physician early this week. Return precautions were given in writing and discussed with the patient. She was discharged home.  This case was managed in conjunction with my attending, Dr. Rhunette CroftNanavati.   Maxine GlennAnn Manna Gose, MD 08/11/14 [phone]

## 2014-08-11 NOTE — ED Provider Notes (Signed)
Deborah Houston is a 18 y.o. female who presents to Urgent Care today for right ear pain. Patient has a three-week history of right ear pain. She was seen in a separate health facility and was prescribed some sort of ear drops that she cannot recall. This did not help. She continues to have right ear pain. No change in hearing. No fevers or chills nausea vomiting or diarrhea. She feels well otherwise.   Past Medical History  Diagnosis Date  . Anemia    History  Substance Use Topics  . Smoking status: Never Smoker   . Smokeless tobacco: Not on file  . Alcohol Use: No   ROS as above Medications: No current facility-administered medications for this encounter.   Current Outpatient Prescriptions  Medication Sig Dispense Refill  . BIOTIN PO Take 1 tablet by mouth daily.      . ciprofloxacin-dexamethasone (CIPRODEX) otic suspension Place 4 drops into the right ear 2 (two) times daily. x5 days  7.5 mL  0  . etonogestrel (NEXPLANON) 68 MG IMPL implant Inject 1 each into the skin continuous.      . IRON PO Take 1 tablet by mouth daily.      Marland Kitchen. LORazepam (ATIVAN PO) Take 0.5 tablets by mouth daily.      . Multiple Vitamin (MULTIVITAMIN WITH MINERALS) TABS tablet Take 1 tablet by mouth daily.      . sertraline (ZOLOFT) 25 MG tablet Take 25 mg by mouth daily.        Exam:  Temperature 98.46F, heart rate 84, respiratory rate 14, oxygen saturation 100% on room air, blood pressure 132/69 Gen: Well NAD HEENT: EOMI,  MMM right ear canal is very minimally erythematous. Tympanic membranes normal. Mastoids are nontender bilaterally. Lungs: Normal work of breathing. CTABL Heart: RRR no MRG Abd: NABS, Soft. Nondistended, Nontender Exts: Brisk capillary refill, warm and well perfused.   No results found for this or any previous visit (from the past 24 hour(s)). No results found.  Assessment and Plan: 18 y.o. female with possible otitis externa treatment with Cortisporin drops followup with PCP or  ENT  Discussed warning signs or symptoms. Please see discharge instructions. Patient expresses understanding.     Rodolph BongEvan S Pamella Samons, MD 08/11/14 [phone]

## 2014-08-11 NOTE — ED Provider Notes (Signed)
I performed a history and physical examination of  Deborah Houston and discussed her management with resident physician. I agree with the history, physical, assessment, and plan of care, with the following exceptions: None I was present for the following procedures: None  Time Spent in Critical Care of the patient: None  Time spent in discussions with the patient and family: 5 min  Deborah Houston  Pt comes in with epistaxis. Bleeding ceased with afrin spray and pressure. Return precautions discussed. Pt is currently asymptomatic.    Derwood KaplanAnkit Tyeisha Dinan, MD 08/11/14 [phone]

## 2014-09-01 ENCOUNTER — Encounter (HOSPITAL_COMMUNITY): Payer: Self-pay | Admitting: Emergency Medicine

## 2014-09-01 ENCOUNTER — Inpatient Hospital Stay (HOSPITAL_COMMUNITY)
Admission: AD | Admit: 2014-09-01 | Discharge: 2014-09-06 | DRG: 885 | Disposition: A | Discharge status: Home / Self Care | Payer: 59 | Source: Intra-hospital | Attending: Psychiatry | Admitting: Psychiatry

## 2014-09-01 ENCOUNTER — Emergency Department (HOSPITAL_COMMUNITY): Payer: 59

## 2014-09-01 ENCOUNTER — Emergency Department (HOSPITAL_COMMUNITY)
Admission: EM | Admit: 2014-09-01 | Discharge: 2014-09-01 | Disposition: A | Discharge status: Home / Self Care | Payer: 59 | Attending: Emergency Medicine | Admitting: Emergency Medicine

## 2014-09-01 DIAGNOSIS — Z792 Long term (current) use of antibiotics: Secondary | ICD-10-CM | POA: Diagnosis not present

## 2014-09-01 DIAGNOSIS — F1193 Opioid use, unspecified with withdrawal: Secondary | ICD-10-CM | POA: Insufficient documentation

## 2014-09-01 DIAGNOSIS — S3991XA Unspecified injury of abdomen, initial encounter: Secondary | ICD-10-CM | POA: Insufficient documentation

## 2014-09-01 DIAGNOSIS — Z9114 Patient's other noncompliance with medication regimen: Secondary | ICD-10-CM | POA: Diagnosis present

## 2014-09-01 DIAGNOSIS — Y9389 Activity, other specified: Secondary | ICD-10-CM | POA: Diagnosis not present

## 2014-09-01 DIAGNOSIS — F332 Major depressive disorder, recurrent severe without psychotic features: Secondary | ICD-10-CM | POA: Diagnosis present

## 2014-09-01 DIAGNOSIS — D649 Anemia, unspecified: Secondary | ICD-10-CM | POA: Diagnosis not present

## 2014-09-01 DIAGNOSIS — S199XXA Unspecified injury of neck, initial encounter: Secondary | ICD-10-CM | POA: Diagnosis not present

## 2014-09-01 DIAGNOSIS — Z79899 Other long term (current) drug therapy: Secondary | ICD-10-CM | POA: Diagnosis not present

## 2014-09-01 DIAGNOSIS — F329 Major depressive disorder, single episode, unspecified: Secondary | ICD-10-CM | POA: Diagnosis present

## 2014-09-01 DIAGNOSIS — Y998 Other external cause status: Secondary | ICD-10-CM | POA: Insufficient documentation

## 2014-09-01 DIAGNOSIS — R45851 Suicidal ideations: Secondary | ICD-10-CM | POA: Diagnosis present

## 2014-09-01 DIAGNOSIS — F419 Anxiety disorder, unspecified: Secondary | ICD-10-CM | POA: Insufficient documentation

## 2014-09-01 DIAGNOSIS — X828XXA Other intentional self-harm by crashing of motor vehicle, initial encounter: Secondary | ICD-10-CM | POA: Diagnosis not present

## 2014-09-01 DIAGNOSIS — Y92410 Unspecified street and highway as the place of occurrence of the external cause: Secondary | ICD-10-CM | POA: Insufficient documentation

## 2014-09-01 DIAGNOSIS — F1520 Other stimulant dependence, uncomplicated: Secondary | ICD-10-CM | POA: Insufficient documentation

## 2014-09-01 DIAGNOSIS — F1123 Opioid dependence with withdrawal: Secondary | ICD-10-CM | POA: Diagnosis present

## 2014-09-01 DIAGNOSIS — S299XXA Unspecified injury of thorax, initial encounter: Secondary | ICD-10-CM | POA: Insufficient documentation

## 2014-09-01 DIAGNOSIS — F1590 Other stimulant use, unspecified, uncomplicated: Secondary | ICD-10-CM | POA: Diagnosis present

## 2014-09-01 DIAGNOSIS — F1120 Opioid dependence, uncomplicated: Secondary | ICD-10-CM | POA: Diagnosis present

## 2014-09-01 HISTORY — DX: Other psychoactive substance abuse, uncomplicated: F19.10

## 2014-09-01 LAB — CBC WITH DIFFERENTIAL/PLATELET
Basophils Absolute: 0 10*3/uL (ref 0.0–0.1)
Basophils Relative: 0 % (ref 0–1)
Eosinophils Absolute: 0 10*3/uL (ref 0.0–0.7)
Eosinophils Relative: 1 % (ref 0–5)
HCT: 32.9 % — ABNORMAL LOW (ref 36.0–46.0)
Hemoglobin: 9.9 g/dL — ABNORMAL LOW (ref 12.0–15.0)
Lymphocytes Relative: 14 % (ref 12–46)
Lymphs Abs: 0.8 10*3/uL (ref 0.7–4.0)
MCH: 20.5 pg — ABNORMAL LOW (ref 26.0–34.0)
MCHC: 30.1 g/dL (ref 30.0–36.0)
MCV: 68.3 fL — ABNORMAL LOW (ref 78.0–100.0)
Monocytes Absolute: 0.6 10*3/uL (ref 0.1–1.0)
Monocytes Relative: 10 % (ref 3–12)
Neutro Abs: 4.2 10*3/uL (ref 1.7–7.7)
Neutrophils Relative %: 75 % (ref 43–77)
Platelets: 288 10*3/uL (ref 150–400)
RBC: 4.82 MIL/uL (ref 3.87–5.11)
RDW: 18 % — ABNORMAL HIGH (ref 11.5–15.5)
WBC: 5.6 10*3/uL (ref 4.0–10.5)

## 2014-09-01 LAB — URINE MICROSCOPIC-ADD ON

## 2014-09-01 LAB — BASIC METABOLIC PANEL
Anion gap: 13 (ref 5–15)
BUN: 12 mg/dL (ref 6–23)
CO2: 22 mEq/L (ref 19–32)
Calcium: 9 mg/dL (ref 8.4–10.5)
Chloride: 104 mEq/L (ref 96–112)
Creatinine, Ser: 0.67 mg/dL (ref 0.50–1.10)
GFR calc Af Amer: >90 mL/min (ref >90)
GFR calc non Af Amer: >90 mL/min (ref >90)
Glucose, Bld: 94 mg/dL (ref 70–99)
Potassium: 4.2 mEq/L (ref 3.7–5.3)
Sodium: 139 mEq/L (ref 137–147)

## 2014-09-01 LAB — RAPID URINE DRUG SCREEN, HOSP PERFORMED
Amphetamines: POSITIVE — AB
Barbiturates: NOT DETECTED
Benzodiazepines: NOT DETECTED
Cocaine: NOT DETECTED
Opiates: POSITIVE — AB
Tetrahydrocannabinol: NOT DETECTED

## 2014-09-01 LAB — URINALYSIS, ROUTINE W REFLEX MICROSCOPIC
Bilirubin Urine: NEGATIVE
Glucose, UA: NEGATIVE mg/dL
Ketones, ur: 15 mg/dL — AB
Leukocytes, UA: NEGATIVE
Nitrite: NEGATIVE
Protein, ur: NEGATIVE mg/dL
Specific Gravity, Urine: 1.046 — ABNORMAL HIGH (ref 1.005–1.030)
Urobilinogen, UA: 0.2 mg/dL (ref 0.0–1.0)
pH: 7.5 (ref 5.0–8.0)

## 2014-09-01 LAB — I-STAT BETA HCG BLOOD, ED (MC, WL, AP ONLY): I-stat hCG, quantitative: <5 m[IU]/mL (ref <5)

## 2014-09-01 LAB — ETHANOL: Alcohol, Ethyl (B): <11 mg/dL (ref 0–11)

## 2014-09-01 MED ORDER — IBUPROFEN 400 MG PO TABS: 600.0000 mg | ORAL_TABLET | Freq: Three times a day (TID) | ORAL | Status: DC | PRN

## 2014-09-01 MED ORDER — ALUM & MAG HYDROXIDE-SIMETH 200-200-20 MG/5ML PO SUSP: 30.0000 mL | ORAL | Status: DC | PRN

## 2014-09-01 MED ORDER — ONDANSETRON 4 MG PO TBDP: 4.0000 mg | ORAL_TABLET | Freq: Four times a day (QID) | ORAL | Status: DC | PRN

## 2014-09-01 MED ORDER — TRAZODONE HCL 50 MG PO TABS
50.0000 mg | ORAL_TABLET | Freq: Every evening | ORAL | Status: DC | PRN
  Administered 2014-09-02 – 2014-09-03 (×2): 50 mg via ORAL
  Filled 2014-09-01 (×2): qty 1

## 2014-09-01 MED ORDER — ZOLPIDEM TARTRATE 5 MG PO TABS: 5.0000 mg | ORAL_TABLET | Freq: Every evening | ORAL | Status: DC | PRN

## 2014-09-01 MED ORDER — OXYCODONE-ACETAMINOPHEN 5-325 MG PO TABS
1.0000 | ORAL_TABLET | Freq: Once | ORAL | Status: AC
  Administered 2014-09-01: 1 via ORAL
  Filled 2014-09-01: qty 1

## 2014-09-01 MED ORDER — NAPROXEN 500 MG PO TABS
500.0000 mg | ORAL_TABLET | Freq: Two times a day (BID) | ORAL | Status: DC | PRN
  Administered 2014-09-01 – 2014-09-06 (×7): 500 mg via ORAL
  Filled 2014-09-01 (×6): qty 1

## 2014-09-01 MED ORDER — SERTRALINE HCL 25 MG PO TABS
25.0000 mg | ORAL_TABLET | Freq: Every day | ORAL | Status: DC
  Administered 2014-09-02 – 2014-09-03 (×2): 25 mg via ORAL
  Filled 2014-09-01 (×5): qty 1

## 2014-09-01 MED ORDER — MORPHINE SULFATE 4 MG/ML IJ SOLN
4.0000 mg | Freq: Once | INTRAMUSCULAR | Status: AC
  Administered 2014-09-01: 4 mg via INTRAVENOUS
  Filled 2014-09-01: qty 1

## 2014-09-01 MED ORDER — ONDANSETRON HCL 4 MG PO TABS: 4.0000 mg | ORAL_TABLET | Freq: Three times a day (TID) | ORAL | Status: DC | PRN

## 2014-09-01 MED ORDER — LOPERAMIDE HCL 2 MG PO CAPS: 2.0000 mg | ORAL_CAPSULE | ORAL | Status: DC | PRN

## 2014-09-01 MED ORDER — LORATADINE 10 MG PO TABS
10.0000 mg | ORAL_TABLET | Freq: Every day | ORAL | Status: DC
  Administered 2014-09-02 – 2014-09-06 (×5): 10 mg via ORAL
  Filled 2014-09-01 (×8): qty 1

## 2014-09-01 MED ORDER — LORAZEPAM 1 MG PO TABS: 1.0000 mg | ORAL_TABLET | Freq: Three times a day (TID) | ORAL | Status: DC | PRN

## 2014-09-01 MED ORDER — HYDROXYZINE HCL 25 MG PO TABS
25.0000 mg | ORAL_TABLET | Freq: Four times a day (QID) | ORAL | Status: DC | PRN
  Administered 2014-09-02: 25 mg via ORAL
  Filled 2014-09-01: qty 1

## 2014-09-01 MED ORDER — IOHEXOL 300 MG/ML  SOLN
100.0000 mL | Freq: Once | INTRAMUSCULAR | Status: AC | PRN
  Administered 2014-09-01: 100 mL via INTRAVENOUS

## 2014-09-01 MED ORDER — CLONIDINE HCL 0.1 MG PO TABS
0.1000 mg | ORAL_TABLET | ORAL | Status: AC
  Filled 2014-09-01 (×4): qty 1

## 2014-09-01 MED ORDER — DICYCLOMINE HCL 20 MG PO TABS: 20.0000 mg | ORAL_TABLET | Freq: Four times a day (QID) | ORAL | Status: DC | PRN

## 2014-09-01 MED ORDER — MAGNESIUM HYDROXIDE 400 MG/5ML PO SUSP: 30.0000 mL | Freq: Every day | ORAL | Status: DC | PRN

## 2014-09-01 MED ORDER — METHOCARBAMOL 500 MG PO TABS
500.0000 mg | ORAL_TABLET | Freq: Three times a day (TID) | ORAL | Status: DC | PRN
  Administered 2014-09-02 – 2014-09-05 (×7): 500 mg via ORAL
  Filled 2014-09-01 (×7): qty 1

## 2014-09-01 MED ORDER — ACETAMINOPHEN 325 MG PO TABS
650.0000 mg | ORAL_TABLET | Freq: Four times a day (QID) | ORAL | Status: DC | PRN
  Administered 2014-09-02: 650 mg via ORAL
  Filled 2014-09-01: qty 2

## 2014-09-01 MED ORDER — AZITHROMYCIN 250 MG PO TABS
250.0000 mg | ORAL_TABLET | Freq: Once | ORAL | Status: AC
  Administered 2014-09-02: 250 mg via ORAL
  Filled 2014-09-01 (×2): qty 1

## 2014-09-01 MED ORDER — CLONIDINE HCL 0.1 MG PO TABS
0.1000 mg | ORAL_TABLET | Freq: Every day | ORAL | Status: DC
  Administered 2014-09-06: 0.1 mg via ORAL
  Filled 2014-09-01 (×2): qty 1

## 2014-09-01 MED ORDER — ACETAMINOPHEN 325 MG PO TABS: 650.0000 mg | ORAL_TABLET | ORAL | Status: DC | PRN

## 2014-09-01 MED ORDER — CLONIDINE HCL 0.1 MG PO TABS
0.1000 mg | ORAL_TABLET | Freq: Four times a day (QID) | ORAL | Status: AC
  Administered 2014-09-01 – 2014-09-03 (×2): 0.1 mg via ORAL
  Filled 2014-09-01 (×11): qty 1

## 2014-09-01 NOTE — ED Notes (Signed)
Pt signed voluntary consent and [facility] of info [facility] fax[facility].

## 2014-09-01 NOTE — Progress Notes (Signed)
Did not attend group 

## 2014-09-01 NOTE — ED Notes (Signed)
Officer gave phone to patient boyfriend.

## 2014-09-01 NOTE — ED Notes (Signed)
Staffing called for sitter.   

## 2014-09-01 NOTE — Tx Team (Signed)
Initial Interdisciplinary Treatment Plan   PATIENT STRESSORS: Educational concerns Financial difficulties Occupational concerns   PATIENT STRENGTHS: Ability for insight Average or above average intelligence Communication skills General fund of knowledge Supportive family/friends   PROBLEM LIST: Problem List/Patient Goals Date to be addressed Date deferred Reason deferred Estimated date of resolution  Depression  09/01/14   dc  Suicide risk 09/01/14   dc                                             DISCHARGE CRITERIA:  Ability to meet basic life and health needs Improved stabilization in mood, thinking, and/or behavior Motivation to continue treatment in a less acute level of care Reduction of life-threatening or endangering symptoms to within safe limits Verbal commitment to aftercare and medication compliance  PRELIMINARY DISCHARGE PLAN: Attend aftercare/continuing care group Attend 12-step recovery group Participate in family therapy Return to previous living arrangement  PATIENT/FAMIILY INVOLVEMENT: This treatment plan has been presented to and reviewed with the patient, Deborah Houston, and/or family member, .  The patient and family have been given the opportunity to ask questions and make suggestions.  Malva LimesStrader, Humphrey Guerreiro 09/01/2014, 6:08 PM

## 2014-09-01 NOTE — ED Notes (Signed)
Boyfriend has arrived, he states she has been clean for 6 months, but used heroin again yesterday and possibly more often in the last week. Prior to him helping her get clean 6 months ago, he states she had a daily use of either snorted or injected drug use. He wants her to get help for this.

## 2014-09-01 NOTE — ED Notes (Signed)
Pt. Was wanded by security
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
## 2014-09-01 NOTE — ED Notes (Signed)
Pt doing tele-psych.  
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
## 2014-09-01 NOTE — ED Notes (Signed)
Per Baxter HireKristen, [facility], [phone] - Dr Jettie BoozeEeapen.

## 2014-09-01 NOTE — ED Notes (Signed)
Pt signed voluntary consent form and release of info form - [facility].
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
## 2014-09-01 NOTE — ED Notes (Signed)
Sitter at bedside.

## 2014-09-02 DIAGNOSIS — F332 Major depressive disorder, recurrent severe without psychotic features: Principal | ICD-10-CM

## 2014-09-02 DIAGNOSIS — F1919 Other psychoactive substance abuse with unspecified psychoactive substance-induced disorder: Secondary | ICD-10-CM

## 2014-09-02 NOTE — [facility] Group Notes (Signed)
[facility] LCSW Group Therapy  09/02/2014   11:00 AM   Type of Therapy:  Group Therapy  Participation Level:  Active  Participation Quality:  Appropriate and Attentive  Affect:  Appropriate, Flat and Depressed  Cognitive:  Alert and Appropriate  Insight:  Developing/Improving and Engaged  Engagement in Therapy:  Developing/Improving and Engaged  Modes of Intervention:  Clarification, Confrontation, Discussion, Education, Exploration, Limit-setting, Orientation, Problem-solving, Rapport Building, Dance movement psychotherapisteality Testing, Socialization and Support  Summary of Progress/Problems: Today's group topic was avoiding self sabotage and enabling behaviors. Group members were asked to define self sabotage and enabling and provide examples. Group members were then asked to discuss unhealthy relationships and how to have positive healthy boundaries with those that enable. Group members were asked to process how communicating needs and establishing a plan to change the above identified behavior.  Pt shared that she was self sabotaging by keeping a friend that uses drugs around, even though she knew she shouldn't use and didn't want to.  Pt states that she has already cut this friend off and by being in the hospital now, knows she can't let her back in.  Pt also discussed her boyfriend being a positive support but pushing him away in the past. Pt processed how she feels she deserves the worst, which leads to her self sabotaging behaviors.    Reyes IvanChelsea Horton, LCSW 09/02/2014 11:46 AM

## 2014-09-02 NOTE — [facility] Suicide Risk Assessment (Signed)
   Nursing information obtained from:   pt Demographic factors:   female, young adult,  Current Mental Status:   depressed Loss Factors:   recent job loss, legal stressors Historical Factors:   denies hx of previous SI  Risk Reduction Factors:   religion, family Total Time spent with patient: 45 minutes  CLINICAL FACTORS:   Depression:   Anhedonia Hopelessness Alcohol/Substance Abuse/Dependencies Unstable or Poor Therapeutic Relationship  Psychiatric Specialty Exam: Physical Exam  Psychiatric: Her speech is normal and behavior is normal. Judgment and thought content normal. Cognition and memory are normal. She exhibits a depressed mood.    ROS  Blood pressure 124/72, pulse 118, temperature 98.7 F (37.1 C), temperature source Oral, resp. rate 18, height 5\' 8"  (1.727 m), weight 62.596 kg (138 lb).Body mass index is 20.99 kg/(m^2).  General Appearance: Casual  Eye Contact::  Good  Speech:  Clear and Coherent and Normal Rate  Volume:  Normal  Mood:  Anxious and Depressed  Affect:  Congruent  Thought Process:  Linear and Logical  Orientation:  Full (Time, Place, and Person)  Thought Content:  Negative  Suicidal Thoughts:  No  Homicidal Thoughts:  No  Memory:  Immediate;   Good Recent;   Good Remote;   Good  Judgement:  Poor  Insight:  Fair  Psychomotor Activity:  Normal  Concentration:  Good  Recall:  Good  Fund of Knowledge:Good  Language: Good  Akathisia:  No  Handed:  Right  AIMS (if indicated):     Assets:  Communication Skills Desire for Improvement Housing Intimacy Social Support Talents/Skills  Sleep:  Number of Hours: 6.5   Musculoskeletal: Strength & Muscle Tone: within normal limits Gait & Station: normal Patient leans: N/A  COGNITIVE FEATURES THAT CONTRIBUTE TO RISK:  Closed-mindedness Loss of executive function Polarized thinking Thought constriction (tunnel vision)    SUICIDE RISK:   Moderate:  Frequent suicidal ideation with limited  intensity, and duration, some specificity in terms of plans, no associated intent, good self-control, limited dysphoria/symptomatology, some risk factors present, and identifiable protective factors, including available and accessible social support.  PLAN OF CARE:  I certify that inpatient services furnished can reasonably be expected to improve the patient's condition.  Oletta DarterGARWAL, Kele Withem 09/02/2014, 9:19 AM
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
## 2014-09-02 NOTE — Progress Notes (Addendum)
D Deborah Houston is slowly getting used to being in the hospital                                                                                                                                                                                                                                                                                    D Deborah Houston is adjusting to being in the hospital. She is coming out of her room and realizing she is lucky to be alive.   A She is taking robaxin for her pain and she is engaged in her recovery. She completed her morning assessment and on it she wrote she denied SI within the past 214 hrs and she rated her depression, hopelessness and anxiety .    R Safety is in place.

## 2014-09-03 DIAGNOSIS — F1190 Opioid use, unspecified, uncomplicated: Secondary | ICD-10-CM

## 2014-09-03 DIAGNOSIS — F1590 Other stimulant use, unspecified, uncomplicated: Secondary | ICD-10-CM

## 2014-09-03 LAB — PREGNANCY, URINE: PREG TEST UR: NEGATIVE

## 2014-09-03 MED ORDER — LORAZEPAM 1 MG PO TABS
1.0000 mg | ORAL_TABLET | Freq: Once | ORAL | Status: AC
  Administered 2014-09-03: 1 mg via ORAL
  Filled 2014-09-03: qty 1

## 2014-09-03 MED ORDER — NICOTINE 21 MG/24HR TD PT24
21.0000 mg | MEDICATED_PATCH | Freq: Every day | TRANSDERMAL | Status: DC
  Administered 2014-09-03 – 2014-09-06 (×4): 21 mg via TRANSDERMAL
  Filled 2014-09-03 (×8): qty 1

## 2014-09-03 MED ORDER — SERTRALINE HCL 50 MG PO TABS
50.0000 mg | ORAL_TABLET | Freq: Every day | ORAL | Status: DC
  Administered 2014-09-04 – 2014-09-06 (×3): 50 mg via ORAL
  Filled 2014-09-03: qty 1
  Filled 2014-09-03: qty 14
  Filled 2014-09-03 (×3): qty 1
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
## 2014-09-03 NOTE — [facility] Group Notes (Signed)
[facility] LCSW Group Therapy  09/03/2014 2:14 PM  Type of Therapy:  Group Therapy  Participation Level: Invited- Did Not Attend  Summary of Progress/Problems: Today's Topic: Overcoming Obstacles. Group members were asked to identify obstacles faced currently and process barriers involved in overcoming these obstacles. Patients identified steps necessary for overcoming these obstacles and explored obstacles that they have overcome in the past.   Smart, Averill Pons LCSWA 09/03/2014, 2:14 PM

## 2014-09-03 NOTE — Progress Notes (Signed)
Adult Psychoeducational Group Note  Date:  09/03/2014 Time:  10:20 PM  Group Topic/Focus:  Wrap-Up Group:   The focus of this group is to help patients review their daily goal of treatment and discuss progress on daily workbooks.  Participation Level:  Minimal  Participation Quality:  Attentive  Affect:  Flat  Cognitive:  Oriented  Insight: Appropriate  Engagement in Group:  Engaged  Modes of Intervention:  Socialization and Support  Additional Comments:  Patient attended and participated in group tonight. She reports having a good day. She attended her groups an went for her meals today. She spoke with her doctor, took all her medication and her boyfriend visited with her.  Lita MainsFrancis, Kyria [facility] 09/03/2014, 10:20 PM

## 2014-09-03 NOTE — Progress Notes (Signed)
Patient ID: Deborah DanceKayla Houston, female   DOB: Dec 17, 1995, 18 y.o.   MRN: [id number]  D: Pt. Denies SI/HI and A/V Hallucinations. Patient reported generalized pain this morning and received Robaxin and Naproxen which decreased her pain. Patient rates her depression 5/10, her hopelessness at 3/10, and anxiety at 8/10 for the day.  Patient is not reporting any withdrawal symptoms other that generalized body aches. Patient reports she slept fair last night, her appetite is good, energy level is normal, and concentration level is good.   A: Support and encouragement provided to the patient to come to Clinical research associatewriter with questions or concerns. Scheduled medications administered to patient per physician's orders.  R: Patient is receptive and cooperative but minimal. Patient is seen in the milieu at times but is seen in the bed between groups. Q15 minute checks are maintained for safety.

## 2014-09-03 NOTE — Progress Notes (Signed)
Writer received report from M. SunTrustPhillips RN. Writer entered patients room and observed her lying in bed asleep, no distress noted. Safety maintained on unit with 15 min checks.

## 2014-09-03 NOTE — Progress Notes (Signed)
D: Pt denies SI/HI/AVH. Pt is pleasant and cooperative. Pt stated she felt anxious and depressed today. Pt said she felt a little better this evening.   A: Pt was offered support and encouragement. Pt was given scheduled medications. Pt was encourage to attend groups. Q 15 minute checks were done for safety.   R:Pt attends groups and interacts well with peers and staff. Pt is taking medication. Pt has no complaints at this time .Pt receptive to treatment and safety maintained on unit.

## 2014-09-03 NOTE — Plan of Care (Signed)
Problem: Ineffective individual coping Goal: LTG: Patient will report a decrease in negative feelings Outcome: Progressing Pt stated she felt a little better tonight Goal: STG: Patient will remain free from self harm Outcome: Progressing AEB documentation and assessment Goal: STG:Pt. will utilize relaxation techniques to reduce stress STG: Patient will utilize relaxation techniques to reduce stress levels  Outcome: Progressing Pt sated she will continue to breathe when she gets anxious, pt stated she usually goes to the gym to help deal with stress

## 2014-09-03 NOTE — Plan of Care (Signed)
Problem: Diagnosis: Increased Risk For Suicide Attempt Goal: STG-Patient Will Comply With Medication Regime Outcome: Progressing Patient is taking medications as prescribed at this time. Patient did not want clonidine for lunch.
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
## 2014-09-04 LAB — TSH: TSH: 0.115 u[IU]/mL — ABNORMAL LOW (ref 0.350–4.500)

## 2014-09-04 MED ORDER — HYDROXYZINE HCL 50 MG PO TABS
50.0000 mg | ORAL_TABLET | Freq: Every evening | ORAL | Status: DC | PRN
Start: 2014-09-04 — End: 2014-09-06
  Administered 2014-09-04 – 2014-09-05 (×2): 50 mg via ORAL
  Filled 2014-09-04 (×9): qty 1
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
## 2014-09-04 NOTE — Progress Notes (Signed)
Patient ID: Deborah Houston, female   DOB: 07/08/1996, 18 y.o.   MRN: [id number]  D: Pt. Denies SI/HI and A/V Hallucinations. Patient reports mild cramping/discomfort and received PRN Robaxin which provided relief. Patient rates her depression 8/10, her hopelessness 5/10 and her anxiety 7/10 for the day. Patient reports she slept fair, appetite is good, normal energy level and concentration is good. Patient reports her focus today is speak to her social worker about discharge.  A: Support and encouragement provided to the patient to come to Clinical research associatewriter with questions or concerns. Scheduled medications administered per physician's orders.  R: Patient is receptive and cooperative but minimal with staff. Patient is seen less in the milieu than yesterday. Patient does not appear to be attending groups. Q15 minute checks are maintained for safety.

## 2014-09-04 NOTE — [facility] Group Notes (Signed)
[facility] LCSW Group Therapy  09/04/2014 , 11:34 AM   Type of Therapy:  Group Therapy  Participation Level:  Active  Participation Quality:  Attentive  Affect:  Appropriate  Cognitive:  Alert  Insight:  Improving  Engagement in Therapy:  Engaged  Modes of Intervention:  Discussion, Exploration and Socialization  Summary of Progress/Problems: Today's group focused on the term Diagnosis.  Participants were asked to define the term, and then pronounce whether it is a negative, positive or neutral term.  Deborah Houston talked about the difficulty she has with asking for help based on her relationship with her grandparents, who dismissed her mental health challenges as insignificant and told her to deal with it.  She has begun to understand that aspect of her challenge, and plans to go forward by being more open with others.  She identified her boyfriend and a Veterinary surgeoncounselor at school as potential resources.  Did not mention substance abuse.  Deborah Houston, Deborah Houston 09/04/2014 , 11:34 AM

## 2014-09-04 NOTE — Progress Notes (Signed)
Patient ID: Deborah DanceKayla Houston, female   DOB: 1996/06/04, 18 y.o.   MRN: [id number]  [facility]:  (Nursing/MHT/Case Management/Adjunct)  Date:  09/04/2014  Time:  1:50 PM  Type of Therapy:  Nurse Education  Participation Level:  Did Not Attend  Participation Quality:  Did not attend  Affect:  Did not attend  Cognitive:  Did not attend  Insight:  None  Engagement in Group:  None  Modes of Intervention:  Did not attend  Summary of Progress/Problems: This group was to discuss recovery and the road to recovery. Coming up with goals to reach the patient's own definition of recovery. Patient was invited but did not attend.

## 2014-09-04 NOTE — [facility] Group Notes (Signed)
Adult Psychoeducational Group Note  Date:  09/04/2014 Time:  9:59 PM  Group Topic/Focus:  Wrap-Up Group:   The focus of this group is to help patients review their daily goal of treatment and discuss progress on daily workbooks.  Participation Level:  Minimal  Participation Quality:  Attentive  Affect:  Appropriate  Cognitive:  Appropriate  Insight: Good  Engagement in Group:  Limited  Modes of Intervention:  Discussion  Additional Comments:  Yarixa expressed her day was pretty good.  She stated she went to the gym and her goal was to figure out medications and talk to doctor.  She also said her grandparents came to visit along with her boyfriend.  She named her support system as her boyfriend, his family and grandparents.  Caroll RancherLindsay, Amariya Liskey A 09/04/2014, 9:59 PM
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
## 2014-09-04 NOTE — [facility] Suicide Risk Assessment (Signed)
[facility] INPATIENT:  Family/Significant Other Suicide Prevention Education  Suicide Prevention Education:  Education Completed; Deborah Houston (pt's boyfriend) [phone] has been identified by the patient as the family member/significant other with whom the patient will be residing, and identified as the person(s) who will aid the patient in the event of a mental health crisis (suicidal ideations/suicide attempt).  With written consent from the patient, the family member/significant other has been provided the following suicide prevention education, prior to the and/or following the discharge of the patient.  The suicide prevention education provided includes the following:  Suicide risk factors  Suicide prevention and interventions  National Suicide Hotline telephone number  [facility] assessment telephone number  [facility] Assistance 911  [address] [facility] and/or Residential Mobile Crisis Unit telephone number  Request made of family/significant other to:  Remove weapons (e.g., guns, rifles, knives), all items previously/currently identified as safety concern.    Remove drugs/medications (over-the-counter, prescriptions, illicit drugs), all items previously/currently identified as a safety concern.  The family member/significant other verbalizes understanding of the suicide prevention education information provided.  The family member/significant other agrees to remove the items of safety concern listed above.  Deborah Houston, Deborah Houston LCSWA  09/04/2014, 3:00 PM
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
## 2014-09-04 NOTE — Clinical Social Work Note (Signed)
Per pt request, CSW contacted her attorney Briscoe Burns(Archie [phone]). He stated that he got her court date continued and did not need a letter faxed by CSW. Pt notified.   The Sherwin-WilliamsHeather Smart, LCSWA 09/04/2014 2:58 PM

## 2014-09-05 MED ORDER — HYDROXYZINE HCL 25 MG PO TABS
25.0000 mg | ORAL_TABLET | Freq: Two times a day (BID) | ORAL | Status: DC | PRN
  Filled 2014-09-05: qty 20
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
## 2014-09-05 NOTE — Plan of Care (Signed)
Problem: Alteration in mood Goal: STG-Patient reports thoughts of self-harm to staff Outcome: Completed/Met Date Met:  09/05/14 Patient reports that she does not have any thoughts to harm herself.

## 2014-09-05 NOTE — Plan of Care (Signed)
Problem: Diagnosis: Increased Risk For Suicide Attempt Goal: STG-Patient Will Attend All Groups On The Unit Outcome: Completed/Met Date Met:  09/05/14 Pt is attending groups and participating.

## 2014-09-05 NOTE — BHH Group Notes (Signed)
[facility]   09/05/2014 10:29 AM  Participation Quality:  Appropriate   Mood/Affect:  Appropriate  Depression Rating:  3  Anxiety Rating:  4  Thoughts of Suicide:  No Will you contract for safety?   NA  Current AVH:  No   Plan for Discharge/Comments:  Pt plans to d/c tomorrow and return to her apartment. "My boyfriend will stay with me for a week." [facility] and therapy but is also requesting appt with PCP to get referred to Ear, nose, and throat specialist for ear infection. CSW assessing.   Transportation Means: boyfriend's grandmother  Supports: boyfriend, friends  Counselling psychologistmart, OncologistHeather LCSWA

## 2014-09-05 NOTE — Plan of Care (Signed)
Problem: Alteration in mood Goal: LTG-Patient reports reduction in suicidal thoughts (Patient reports reduction in suicidal thoughts and is able to verbalize a safety plan for whenever patient is feeling suicidal)  Outcome: Completed/Met Date Met:  09/05/14 Pt reports she is no longer having suicidal thoughts.

## 2014-09-05 NOTE — BHH Group Notes (Signed)
B[facility] LCSW Group Therapy  09/05/2014 1:24 PM  Type of Therapy:  Group Therapy  Participation Level:  Active  Participation Quality:  Appropriate  Affect:  Appropriate  Cognitive:  Alert  Insight:  Engaged  Engagement in Therapy:  Engaged  Modes of Intervention:  Discussion, Education, Exploration, Rapport Building, Socialization and Support  Summary of Progress/Problems: MHA Speaker came to talk about his personal journey with substance abuse and mental illness. The pt processed ways by which to relate to the speaker. MHA speaker provided handouts and educational information pertaining to groups and services offered by the [facility].    Smart, Kunio Cummiskey LCSWA 09/05/2014, 1:24 PM
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
## 2014-09-06 DIAGNOSIS — F1193 Opioid use, unspecified with withdrawal: Secondary | ICD-10-CM | POA: Insufficient documentation

## 2014-09-06 DIAGNOSIS — F1120 Opioid dependence, uncomplicated: Secondary | ICD-10-CM | POA: Diagnosis present

## 2014-09-06 DIAGNOSIS — F1520 Other stimulant dependence, uncomplicated: Secondary | ICD-10-CM | POA: Insufficient documentation

## 2014-09-06 DIAGNOSIS — F332 Major depressive disorder, recurrent severe without psychotic features: Secondary | ICD-10-CM | POA: Diagnosis present

## 2014-09-06 DIAGNOSIS — F1123 Opioid dependence with withdrawal: Secondary | ICD-10-CM | POA: Insufficient documentation

## 2014-09-06 DIAGNOSIS — F1590 Other stimulant use, unspecified, uncomplicated: Secondary | ICD-10-CM | POA: Diagnosis present

## 2014-09-06 MED ORDER — SERTRALINE HCL 50 MG PO TABS: 50.0000 mg | ORAL_TABLET | Freq: Every day | ORAL | Status: DC

## 2014-09-06 MED ORDER — HYDROXYZINE HCL 25 MG PO TABS: 25.0000 mg | ORAL_TABLET | Freq: Two times a day (BID) | ORAL | Status: DC | PRN

## 2014-09-06 MED ORDER — HYDROXYZINE HCL 25 MG PO TABS: 25.0000 mg | ORAL_TABLET | Freq: Two times a day (BID) | ORAL | Status: AC | PRN

## 2014-09-06 MED ORDER — SERTRALINE HCL 50 MG PO TABS: 50.0000 mg | ORAL_TABLET | Freq: Every day | ORAL | Status: AC

## 2014-09-06 NOTE — Progress Notes (Signed)
Pt reports she has had a good day.  She denies SI/HI/AV.  Pt states she is supposed to discharge home tomorrow.   She wants to get well so she can get back to school.  Pt is med compliant on the unit.   She feels her meds are working for her.  She makes her needs known to staff.  Support and encouragement offered.  Safety maintained with q15 minute checks.

## 2014-09-06 NOTE — Progress Notes (Signed)
D:  Patient's self inventory sheet, patient has fair sleep, sleep medication is not helpful.  Good appetite, normal energy level, good concentration.  Rated depression 2, denied hopeless, anxiety 7.  Denied withdrawals.  Denied SI.  Physical pain, worst pain #2, left ribs, right knee, neck.  Pain medication is helpful.  Goal is to be discharged today, set up resources outside hospital.  Plans to talk to SW.  Thanks for all you have done.  Does have discharge plans.  No problems anticipated after discharge. A:  Medications administered per MD orders.  Emotional support and encouragement given patient.   R:  Denied SI and HI.  Denied A/V hallucinations.  Safety maintained with 15 minute checks.

## 2014-09-06 NOTE — Progress Notes (Signed)
Adult Psychoeducational Group Note  Date:  09/06/2014 Time:  1:15 AM  Group Topic/Focus:  Wrap-Up Group:   The focus of this group is to help patients review their daily goal of treatment and discuss progress on daily workbooks.  Participation Level:  Active  Participation Quality:  Attentive  Affect:  Appropriate  Cognitive:  Alert  Insight: Good  Engagement in Group:  Engaged  Modes of Intervention:  Discussion  Additional Comments:  Patient says that she had a pretty good day today. Patient says that she is going to discharge on tomorrow and has a plan for aftercare in place. Patients boyfriend came for a visit today and that went well.  JOHNSON,TAWANA 09/06/2014, 1:15 AM
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
## 2014-09-06 NOTE — BHH Group Notes (Signed)
The focus of this group is to educate the patient on the purpose and policies of crisis stabilization and provide a format to answer questions about their admission.  The group details unit policies and expectations of patients while admitted.  Patient attended 0900 nurse education orientation group this morning.  Patient actively participated, appropriate affect, alert, appropriate insight and engagement.  Today patient will work on 3 goals for discharge.  

## 2014-09-06 NOTE — Progress Notes (Signed)
[facility] :  Will you be returning to the same living situation after discharge: Yes,  home to apartment-boyfriend will be staying with her for awhile At discharge, do you have transportation home?:Yes,  boyfriend's grandmother will be picking her up at d/c today Do you have the ability to pay for your medications:Yes,  [employer]  Release of information consent forms completed and submitted to Medical Records by CSW.  Patient to Follow up at: Follow-up Information    Follow up with [facility]  On 10/08/2014.   Why:  Appt. with Doree BarthelMeg Blackmon NP for medication management at 8:20AM on this date. (this will be a 40 minute appt).    Contact information:   Marlana Salvagenna M. [facility] [address] [place], [place] [address] Phone: [phone] Fax: [phone]      [facility] On 09/12/2014.   Why:  Appt for hospital follow-up/medication management at 1:45PM on this date. [facility] next month so you will need to see your PCP for med management until then.    Contact information:   ATTN: Dr. Terance HartBronstein [address] [place], [place] [address] Phone: [phone] Fax: [phone]      Follow up with [facility]Therapy In 1 day.   Why:  Appt. for theapy on Friday, 09/07/14. Please arrive at 10:00AM (30 minutes to complete paperwork). You will meet with therapist at 10:30AM.   Contact information:   Marlana Salvagenna M. [facility] [address] [place], [place] [address] Phone: [phone]      Patient denies SI/HI:   Yes,  during self report/group    Safety Planning and Suicide Prevention discussed:  Yes,  SPE completed with pt's boyfriend. SPI pamphlet provided to pt and she was encouraged to share information with support network, ask questions, and talk about any concerns relating to SPE.  Smart, Loy Mccartt  LSCWA  09/06/2014, 10:05 AM

## 2014-09-06 NOTE — Progress Notes (Signed)
Discharge Note:    Patient discharged home with family member.  Denied SI and HI.  Denied A/V hallucinations.  Suicide prevention information given and discussed with patient who stated she understood and had no questions.  Patient stated she received all her belongings, toiletries, misc items, clothing, prescriptions, medications.  Patient stated she appreciated all assistance received from [facility].
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
## 2014-09-10 NOTE — Progress Notes (Signed)
Patient Discharge Instructions:  After Visit Summary (AVS):   Faxed to:  09/10/14 Discharge Summary Note:   Faxed to:  09/10/14 Psychiatric Admission Assessment Note:   Faxed to:  09/10/14 Suicide Risk Assessment - Discharge Assessment:   Faxed to:  09/10/14 Faxed/Sent to the Next Level Care provider:  09/10/14 Faxed to [facility] @ [phone] [facility] to [facility] @ [phone]  Jerelene ReddenSheena E [place], 09/10/2014, 3:39 PM

## 2015-01-18 DEATH — deceased
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
